# Patient Record
Sex: Female | Born: 1955 | Race: Black or African American | Hispanic: No | Marital: Single | Smoking: Former smoker
Health system: Southern US, Community
[De-identification: ages and names within clinical notes are randomized; demographics above are authoritative.]

## PROBLEM LIST (undated history)

## (undated) DIAGNOSIS — E0500 Thyrotoxicosis with diffuse goiter without thyrotoxic crisis or storm: Secondary | ICD-10-CM

## (undated) DIAGNOSIS — R7303 Prediabetes: Secondary | ICD-10-CM

## (undated) DIAGNOSIS — K648 Other hemorrhoids: Secondary | ICD-10-CM

## (undated) DIAGNOSIS — K56609 Unspecified intestinal obstruction, unspecified as to partial versus complete obstruction: Secondary | ICD-10-CM

## (undated) DIAGNOSIS — K5790 Diverticulosis of intestine, part unspecified, without perforation or abscess without bleeding: Secondary | ICD-10-CM

## (undated) DIAGNOSIS — E0590 Thyrotoxicosis, unspecified without thyrotoxic crisis or storm: Secondary | ICD-10-CM

## (undated) DIAGNOSIS — H409 Unspecified glaucoma: Secondary | ICD-10-CM

## (undated) DIAGNOSIS — K635 Polyp of colon: Secondary | ICD-10-CM

## (undated) DIAGNOSIS — I10 Essential (primary) hypertension: Secondary | ICD-10-CM

## (undated) HISTORY — PX: ABDOMINAL HYSTERECTOMY: SHX81

## (undated) HISTORY — DX: Thyrotoxicosis with diffuse goiter without thyrotoxic crisis or storm: E05.00

## (undated) HISTORY — PX: HEMORRHOID SURGERY: SHX153

## (undated) HISTORY — DX: Thyrotoxicosis, unspecified without thyrotoxic crisis or storm: E05.90

## (undated) HISTORY — DX: Other hemorrhoids: K64.8

## (undated) HISTORY — PX: TUBAL LIGATION: SHX77

## (undated) HISTORY — DX: Prediabetes: R73.03

## (undated) HISTORY — DX: Polyp of colon: K63.5

## (undated) HISTORY — PX: OTHER SURGICAL HISTORY: SHX169

## (undated) HISTORY — DX: Diverticulosis of intestine, part unspecified, without perforation or abscess without bleeding: K57.90

---

## 1998-05-11 ENCOUNTER — Other Ambulatory Visit: Admission: RE | Admit: 1998-05-11 | Discharge: 1998-05-11 | Payer: Self-pay | Admitting: Hematology and Oncology

## 1999-07-16 ENCOUNTER — Emergency Department (HOSPITAL_COMMUNITY): Admission: EM | Admit: 1999-07-16 | Discharge: 1999-07-16 | Payer: Self-pay | Admitting: Emergency Medicine

## 1999-07-16 ENCOUNTER — Encounter: Payer: Self-pay | Admitting: Emergency Medicine

## 1999-09-25 ENCOUNTER — Ambulatory Visit (HOSPITAL_COMMUNITY): Admission: RE | Admit: 1999-09-25 | Discharge: 1999-09-25 | Payer: Self-pay | Admitting: Internal Medicine

## 1999-09-25 ENCOUNTER — Encounter: Payer: Self-pay | Admitting: Internal Medicine

## 2000-07-27 ENCOUNTER — Encounter: Payer: Self-pay | Admitting: Emergency Medicine

## 2000-07-27 ENCOUNTER — Emergency Department (HOSPITAL_COMMUNITY): Admission: EM | Admit: 2000-07-27 | Discharge: 2000-07-27 | Payer: Self-pay | Admitting: Emergency Medicine

## 2000-12-03 ENCOUNTER — Ambulatory Visit (HOSPITAL_COMMUNITY): Admission: RE | Admit: 2000-12-03 | Discharge: 2000-12-03 | Payer: Self-pay | Admitting: *Deleted

## 2001-09-14 ENCOUNTER — Emergency Department (HOSPITAL_COMMUNITY): Admission: EM | Admit: 2001-09-14 | Discharge: 2001-09-14 | Payer: Self-pay | Admitting: Emergency Medicine

## 2001-09-14 ENCOUNTER — Encounter: Payer: Self-pay | Admitting: Emergency Medicine

## 2001-11-17 ENCOUNTER — Emergency Department (HOSPITAL_COMMUNITY): Admission: EM | Admit: 2001-11-17 | Discharge: 2001-11-17 | Payer: Self-pay | Admitting: Emergency Medicine

## 2002-06-13 ENCOUNTER — Emergency Department (HOSPITAL_COMMUNITY): Admission: EM | Admit: 2002-06-13 | Discharge: 2002-06-14 | Payer: Self-pay | Admitting: Emergency Medicine

## 2003-03-14 ENCOUNTER — Emergency Department (HOSPITAL_COMMUNITY): Admission: EM | Admit: 2003-03-14 | Discharge: 2003-03-14 | Payer: Self-pay | Admitting: Emergency Medicine

## 2003-09-02 ENCOUNTER — Emergency Department (HOSPITAL_COMMUNITY): Admission: AD | Admit: 2003-09-02 | Discharge: 2003-09-02 | Payer: Self-pay | Admitting: Family Medicine

## 2003-09-02 ENCOUNTER — Ambulatory Visit (HOSPITAL_COMMUNITY): Admission: RE | Admit: 2003-09-02 | Discharge: 2003-09-02 | Payer: Self-pay | Admitting: Family Medicine

## 2003-09-02 ENCOUNTER — Encounter: Payer: Self-pay | Admitting: Family Medicine

## 2003-09-18 ENCOUNTER — Emergency Department (HOSPITAL_COMMUNITY): Admission: AD | Admit: 2003-09-18 | Discharge: 2003-09-18 | Payer: Self-pay | Admitting: Family Medicine

## 2004-02-08 ENCOUNTER — Emergency Department (HOSPITAL_COMMUNITY): Admission: EM | Admit: 2004-02-08 | Discharge: 2004-02-09 | Payer: Self-pay | Admitting: Internal Medicine

## 2004-08-12 ENCOUNTER — Ambulatory Visit: Payer: Self-pay | Admitting: *Deleted

## 2004-10-07 ENCOUNTER — Ambulatory Visit: Payer: Self-pay | Admitting: Family Medicine

## 2004-10-16 ENCOUNTER — Ambulatory Visit (HOSPITAL_COMMUNITY): Admission: RE | Admit: 2004-10-16 | Discharge: 2004-10-16 | Payer: Self-pay | Admitting: Internal Medicine

## 2004-10-31 ENCOUNTER — Ambulatory Visit: Payer: Self-pay | Admitting: Family Medicine

## 2004-11-11 ENCOUNTER — Ambulatory Visit: Payer: Self-pay | Admitting: Family Medicine

## 2004-11-18 ENCOUNTER — Ambulatory Visit: Payer: Self-pay | Admitting: Family Medicine

## 2004-11-25 ENCOUNTER — Ambulatory Visit: Payer: Self-pay | Admitting: Family Medicine

## 2004-12-09 ENCOUNTER — Ambulatory Visit (HOSPITAL_COMMUNITY): Admission: RE | Admit: 2004-12-09 | Discharge: 2004-12-09 | Payer: Self-pay | Admitting: Internal Medicine

## 2004-12-26 ENCOUNTER — Ambulatory Visit: Payer: Self-pay | Admitting: Family Medicine

## 2004-12-27 ENCOUNTER — Encounter (INDEPENDENT_AMBULATORY_CARE_PROVIDER_SITE_OTHER): Payer: Self-pay | Admitting: Specialist

## 2004-12-27 ENCOUNTER — Ambulatory Visit (HOSPITAL_COMMUNITY): Admission: RE | Admit: 2004-12-27 | Discharge: 2004-12-27 | Payer: Self-pay | Admitting: General Surgery

## 2005-01-10 ENCOUNTER — Emergency Department (HOSPITAL_COMMUNITY): Admission: EM | Admit: 2005-01-10 | Discharge: 2005-01-10 | Payer: Self-pay | Admitting: Emergency Medicine

## 2005-01-28 ENCOUNTER — Ambulatory Visit: Payer: Self-pay | Admitting: Obstetrics and Gynecology

## 2005-03-21 ENCOUNTER — Ambulatory Visit: Payer: Self-pay | Admitting: Family Medicine

## 2005-03-27 ENCOUNTER — Ambulatory Visit: Payer: Self-pay | Admitting: Family Medicine

## 2005-04-29 ENCOUNTER — Ambulatory Visit: Payer: Self-pay | Admitting: Family Medicine

## 2005-05-28 ENCOUNTER — Ambulatory Visit: Payer: Self-pay | Admitting: Family Medicine

## 2005-07-24 ENCOUNTER — Ambulatory Visit: Payer: Self-pay | Admitting: Family Medicine

## 2005-07-24 LAB — CONVERTED CEMR LAB

## 2005-12-08 ENCOUNTER — Ambulatory Visit: Payer: Self-pay | Admitting: Family Medicine

## 2006-01-15 ENCOUNTER — Ambulatory Visit: Payer: Self-pay | Admitting: Family Medicine

## 2006-01-16 ENCOUNTER — Ambulatory Visit (HOSPITAL_COMMUNITY): Admission: RE | Admit: 2006-01-16 | Discharge: 2006-01-16 | Payer: Self-pay | Admitting: Internal Medicine

## 2006-02-19 ENCOUNTER — Ambulatory Visit (HOSPITAL_COMMUNITY): Admission: RE | Admit: 2006-02-19 | Discharge: 2006-02-19 | Payer: Self-pay | Admitting: General Surgery

## 2006-02-23 ENCOUNTER — Ambulatory Visit: Payer: Self-pay | Admitting: Family Medicine

## 2006-04-27 ENCOUNTER — Ambulatory Visit: Payer: Self-pay | Admitting: Family Medicine

## 2006-05-20 ENCOUNTER — Ambulatory Visit: Payer: Self-pay | Admitting: Family Medicine

## 2006-05-26 ENCOUNTER — Ambulatory Visit: Payer: Self-pay | Admitting: Family Medicine

## 2006-06-04 ENCOUNTER — Ambulatory Visit: Payer: Self-pay | Admitting: Obstetrics & Gynecology

## 2006-06-09 ENCOUNTER — Ambulatory Visit (HOSPITAL_COMMUNITY): Admission: RE | Admit: 2006-06-09 | Discharge: 2006-06-09 | Payer: Self-pay | Admitting: Obstetrics and Gynecology

## 2006-06-18 ENCOUNTER — Ambulatory Visit: Payer: Self-pay | Admitting: Obstetrics and Gynecology

## 2006-06-23 ENCOUNTER — Ambulatory Visit: Payer: Self-pay | Admitting: Family Medicine

## 2006-07-25 ENCOUNTER — Emergency Department (HOSPITAL_COMMUNITY): Admission: EM | Admit: 2006-07-25 | Discharge: 2006-07-25 | Payer: Self-pay | Admitting: Emergency Medicine

## 2006-08-04 ENCOUNTER — Ambulatory Visit: Payer: Self-pay | Admitting: Family Medicine

## 2006-09-02 ENCOUNTER — Ambulatory Visit: Payer: Self-pay | Admitting: Family Medicine

## 2006-09-10 DIAGNOSIS — Z9079 Acquired absence of other genital organ(s): Secondary | ICD-10-CM | POA: Insufficient documentation

## 2006-09-14 ENCOUNTER — Inpatient Hospital Stay (HOSPITAL_COMMUNITY): Admission: RE | Admit: 2006-09-14 | Discharge: 2006-09-17 | Payer: Self-pay | Admitting: Obstetrics and Gynecology

## 2006-09-14 ENCOUNTER — Ambulatory Visit: Payer: Self-pay | Admitting: Obstetrics and Gynecology

## 2006-09-14 ENCOUNTER — Encounter (INDEPENDENT_AMBULATORY_CARE_PROVIDER_SITE_OTHER): Payer: Self-pay | Admitting: *Deleted

## 2006-09-30 ENCOUNTER — Ambulatory Visit: Payer: Self-pay | Admitting: Obstetrics & Gynecology

## 2006-10-28 ENCOUNTER — Ambulatory Visit: Payer: Self-pay | Admitting: Obstetrics & Gynecology

## 2007-02-24 ENCOUNTER — Ambulatory Visit: Payer: Self-pay | Admitting: Family Medicine

## 2007-04-19 ENCOUNTER — Encounter (INDEPENDENT_AMBULATORY_CARE_PROVIDER_SITE_OTHER): Payer: Self-pay | Admitting: Family Medicine

## 2007-04-19 ENCOUNTER — Ambulatory Visit: Payer: Self-pay | Admitting: Internal Medicine

## 2007-04-19 DIAGNOSIS — D259 Leiomyoma of uterus, unspecified: Secondary | ICD-10-CM | POA: Insufficient documentation

## 2007-04-19 DIAGNOSIS — D509 Iron deficiency anemia, unspecified: Secondary | ICD-10-CM

## 2007-04-19 DIAGNOSIS — F1010 Alcohol abuse, uncomplicated: Secondary | ICD-10-CM | POA: Insufficient documentation

## 2007-04-19 DIAGNOSIS — K649 Unspecified hemorrhoids: Secondary | ICD-10-CM

## 2007-04-19 DIAGNOSIS — I10 Essential (primary) hypertension: Secondary | ICD-10-CM | POA: Insufficient documentation

## 2007-07-14 ENCOUNTER — Ambulatory Visit: Payer: Self-pay | Admitting: Internal Medicine

## 2007-07-15 ENCOUNTER — Ambulatory Visit (HOSPITAL_COMMUNITY): Admission: RE | Admit: 2007-07-15 | Discharge: 2007-07-15 | Payer: Self-pay | Admitting: Nurse Practitioner

## 2007-07-19 ENCOUNTER — Ambulatory Visit (HOSPITAL_COMMUNITY): Admission: RE | Admit: 2007-07-19 | Discharge: 2007-07-19 | Payer: Self-pay | Admitting: Family Medicine

## 2007-07-26 ENCOUNTER — Ambulatory Visit: Payer: Self-pay | Admitting: Nurse Practitioner

## 2007-07-28 ENCOUNTER — Encounter (INDEPENDENT_AMBULATORY_CARE_PROVIDER_SITE_OTHER): Payer: Self-pay | Admitting: *Deleted

## 2007-08-09 ENCOUNTER — Ambulatory Visit: Payer: Self-pay | Admitting: Nurse Practitioner

## 2007-09-09 ENCOUNTER — Ambulatory Visit: Payer: Self-pay | Admitting: Internal Medicine

## 2007-10-11 ENCOUNTER — Inpatient Hospital Stay (HOSPITAL_COMMUNITY): Admission: AD | Admit: 2007-10-11 | Discharge: 2007-10-11 | Payer: Self-pay | Admitting: Obstetrics and Gynecology

## 2007-10-14 ENCOUNTER — Ambulatory Visit: Payer: Self-pay | Admitting: Family Medicine

## 2007-10-19 ENCOUNTER — Encounter (INDEPENDENT_AMBULATORY_CARE_PROVIDER_SITE_OTHER): Payer: Self-pay | Admitting: Nurse Practitioner

## 2007-10-19 ENCOUNTER — Ambulatory Visit: Payer: Self-pay | Admitting: Family Medicine

## 2007-10-19 LAB — CONVERTED CEMR LAB
ALT: 20 units/L (ref 0–35)
BUN: 14 mg/dL (ref 6–23)
CO2: 29 meq/L (ref 19–32)
Calcium: 9.6 mg/dL (ref 8.4–10.5)
Chloride: 102 meq/L (ref 96–112)
Creatinine, Ser: 0.76 mg/dL (ref 0.40–1.20)
Glucose, Bld: 87 mg/dL (ref 70–99)
HCT: 41.6 % (ref 36.0–46.0)
Hemoglobin: 13.5 g/dL (ref 12.0–15.0)
Lymphocytes Relative: 31 % (ref 12–46)
Lymphs Abs: 1.2 10*3/uL (ref 0.7–4.0)
Monocytes Absolute: 0.5 10*3/uL (ref 0.1–1.0)
Monocytes Relative: 13 % — ABNORMAL HIGH (ref 3–12)
Neutro Abs: 1.9 10*3/uL (ref 1.7–7.7)
RBC: 4.42 M/uL (ref 3.87–5.11)

## 2007-10-28 ENCOUNTER — Ambulatory Visit: Payer: Self-pay | Admitting: Obstetrics and Gynecology

## 2007-11-22 ENCOUNTER — Ambulatory Visit (HOSPITAL_COMMUNITY): Admission: RE | Admit: 2007-11-22 | Discharge: 2007-11-22 | Payer: Self-pay | Admitting: Obstetrics and Gynecology

## 2007-11-23 ENCOUNTER — Ambulatory Visit: Payer: Self-pay | Admitting: Internal Medicine

## 2007-12-07 ENCOUNTER — Ambulatory Visit: Payer: Self-pay | Admitting: Internal Medicine

## 2008-01-07 ENCOUNTER — Ambulatory Visit: Payer: Self-pay | Admitting: Internal Medicine

## 2008-01-07 ENCOUNTER — Encounter (INDEPENDENT_AMBULATORY_CARE_PROVIDER_SITE_OTHER): Payer: Self-pay | Admitting: Nurse Practitioner

## 2008-01-07 LAB — CONVERTED CEMR LAB
Amphetamine Screen, Ur: NEGATIVE
Benzodiazepines.: NEGATIVE
Cocaine Metabolites: NEGATIVE
Methadone: NEGATIVE
Phencyclidine (PCP): NEGATIVE

## 2008-01-18 ENCOUNTER — Encounter (INDEPENDENT_AMBULATORY_CARE_PROVIDER_SITE_OTHER): Payer: Self-pay | Admitting: Nurse Practitioner

## 2008-01-18 ENCOUNTER — Ambulatory Visit: Payer: Self-pay | Admitting: Family Medicine

## 2008-01-18 LAB — CONVERTED CEMR LAB
Benzodiazepines.: NEGATIVE
Creatinine,U: 57.8 mg/dL
Marijuana Metabolite: NEGATIVE
Phencyclidine (PCP): NEGATIVE
Propoxyphene: NEGATIVE

## 2008-02-01 ENCOUNTER — Ambulatory Visit: Payer: Self-pay | Admitting: Internal Medicine

## 2008-02-17 ENCOUNTER — Ambulatory Visit: Payer: Self-pay | Admitting: *Deleted

## 2008-02-24 ENCOUNTER — Emergency Department (HOSPITAL_COMMUNITY): Admission: EM | Admit: 2008-02-24 | Discharge: 2008-02-25 | Payer: Self-pay | Admitting: Emergency Medicine

## 2008-03-07 ENCOUNTER — Ambulatory Visit: Payer: Self-pay | Admitting: Internal Medicine

## 2008-03-07 ENCOUNTER — Encounter (INDEPENDENT_AMBULATORY_CARE_PROVIDER_SITE_OTHER): Payer: Self-pay | Admitting: Nurse Practitioner

## 2008-03-07 LAB — CONVERTED CEMR LAB
Cocaine Metabolites: NEGATIVE
Creatinine,U: 30.5 mg/dL
Opiate Screen, Urine: NEGATIVE
Phencyclidine (PCP): NEGATIVE

## 2008-06-02 ENCOUNTER — Ambulatory Visit: Payer: Self-pay | Admitting: Internal Medicine

## 2008-07-20 ENCOUNTER — Ambulatory Visit: Payer: Self-pay | Admitting: Family Medicine

## 2008-08-02 ENCOUNTER — Emergency Department (HOSPITAL_COMMUNITY): Admission: EM | Admit: 2008-08-02 | Discharge: 2008-08-02 | Payer: Self-pay | Admitting: Emergency Medicine

## 2008-08-07 ENCOUNTER — Ambulatory Visit: Payer: Self-pay | Admitting: Internal Medicine

## 2008-08-08 ENCOUNTER — Ambulatory Visit (HOSPITAL_COMMUNITY): Admission: RE | Admit: 2008-08-08 | Discharge: 2008-08-08 | Payer: Self-pay | Admitting: Family Medicine

## 2008-09-26 ENCOUNTER — Encounter: Payer: Self-pay | Admitting: Internal Medicine

## 2008-09-26 ENCOUNTER — Ambulatory Visit: Payer: Self-pay | Admitting: Internal Medicine

## 2008-09-26 LAB — CONVERTED CEMR LAB
Cholesterol: 179 mg/dL (ref 0–200)
HDL: 62 mg/dL (ref >39)
Total CHOL/HDL Ratio: 2.9

## 2008-10-02 ENCOUNTER — Ambulatory Visit: Payer: Self-pay | Admitting: Internal Medicine

## 2008-10-22 ENCOUNTER — Emergency Department (HOSPITAL_COMMUNITY): Admission: EM | Admit: 2008-10-22 | Discharge: 2008-10-22 | Payer: Self-pay | Admitting: Emergency Medicine

## 2008-10-23 ENCOUNTER — Inpatient Hospital Stay (HOSPITAL_COMMUNITY): Admission: AD | Admit: 2008-10-23 | Discharge: 2008-10-23 | Payer: Self-pay | Admitting: Obstetrics & Gynecology

## 2008-10-30 ENCOUNTER — Ambulatory Visit: Payer: Self-pay | Admitting: Internal Medicine

## 2008-10-30 ENCOUNTER — Encounter (INDEPENDENT_AMBULATORY_CARE_PROVIDER_SITE_OTHER): Payer: Self-pay | Admitting: Adult Health

## 2008-10-30 LAB — CONVERTED CEMR LAB
Alkaline Phosphatase: 58 units/L (ref 39–117)
BUN: 17 mg/dL (ref 6–23)
Glucose, Bld: 92 mg/dL (ref 70–99)
Total Bilirubin: 0.5 mg/dL (ref 0.3–1.2)

## 2008-11-06 ENCOUNTER — Ambulatory Visit: Payer: Self-pay | Admitting: Internal Medicine

## 2008-11-15 ENCOUNTER — Ambulatory Visit: Payer: Self-pay | Admitting: Internal Medicine

## 2008-12-27 ENCOUNTER — Ambulatory Visit: Payer: Self-pay | Admitting: Family Medicine

## 2009-04-12 ENCOUNTER — Ambulatory Visit: Payer: Self-pay | Admitting: Internal Medicine

## 2009-05-16 ENCOUNTER — Ambulatory Visit: Payer: Self-pay | Admitting: Family Medicine

## 2009-07-04 ENCOUNTER — Emergency Department (HOSPITAL_COMMUNITY): Admission: EM | Admit: 2009-07-04 | Discharge: 2009-07-04 | Payer: Self-pay | Admitting: Family Medicine

## 2009-09-19 ENCOUNTER — Encounter (INDEPENDENT_AMBULATORY_CARE_PROVIDER_SITE_OTHER): Payer: Self-pay | Admitting: Adult Health

## 2009-09-19 ENCOUNTER — Encounter: Payer: Self-pay | Admitting: Family Medicine

## 2009-09-19 ENCOUNTER — Ambulatory Visit: Payer: Self-pay | Admitting: Internal Medicine

## 2009-09-19 LAB — CONVERTED CEMR LAB
ALT: 20 units/L (ref 0–35)
Albumin: 5.2 g/dL (ref 3.5–5.2)
Alkaline Phosphatase: 75 units/L (ref 39–117)
Basophils Absolute: 0 10*3/uL (ref 0.0–0.1)
CO2: 25 meq/L (ref 19–32)
Glucose, Bld: 99 mg/dL (ref 70–99)
Hemoglobin: 14.1 g/dL (ref 12.0–15.0)
Lymphocytes Relative: 38 % (ref 12–46)
Microalb, Ur: 1.94 mg/dL — ABNORMAL HIGH (ref 0.00–1.89)
Monocytes Absolute: 0.7 10*3/uL (ref 0.1–1.0)
Monocytes Relative: 13 % — ABNORMAL HIGH (ref 3–12)
Neutro Abs: 2.5 10*3/uL (ref 1.7–7.7)
Potassium: 3.6 meq/L (ref 3.5–5.3)
RBC: 4.72 M/uL (ref 3.87–5.11)
Sodium: 137 meq/L (ref 135–145)
Total Protein: 7.9 g/dL (ref 6.0–8.3)

## 2009-10-31 IMAGING — US US TRANSVAGINAL NON-OB
1 series · 13 of 25 positions shown · non-contrast
Comparison: none

CLINICAL DATA: Status post hysterectomy with abdominal pain.  Both ovaries remain.  
TRANSABDOMINAL AND TRANSVAGINAL PELVIC ULTRASOUND:
TECHNIQUE: Both transabdominal and transvaginal ultrasound examinations of the pelvis were performed.

[Series 1: us transvaginal non-ob · 0.30mm/px · 13 of 45 slices shown]
[im 1/45]
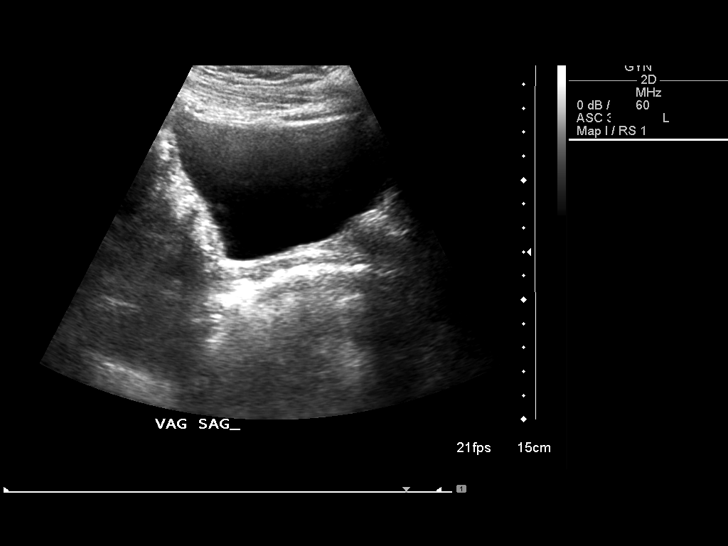
[im 4/45]
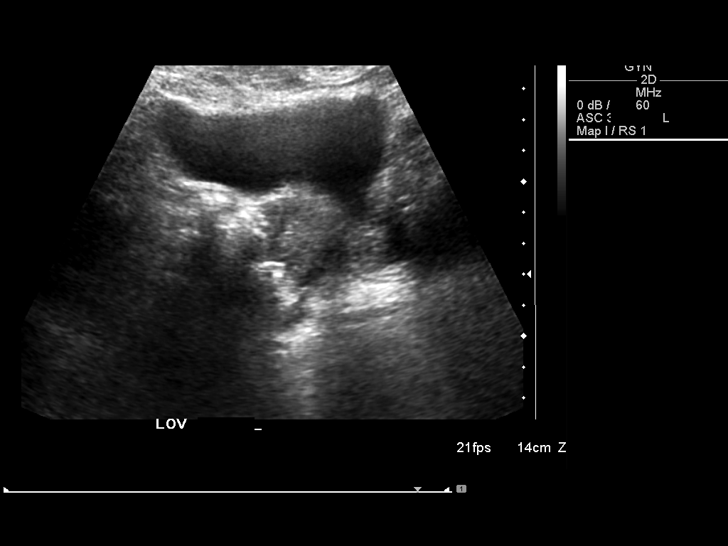
[im 8/45]
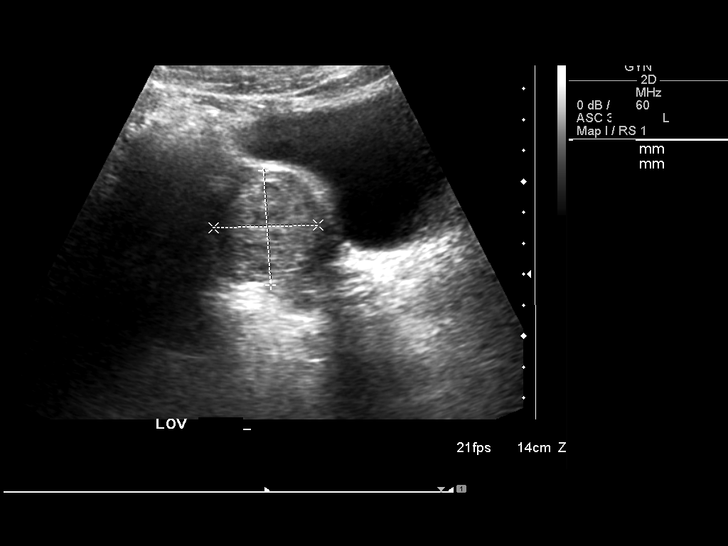
[im 12/45]
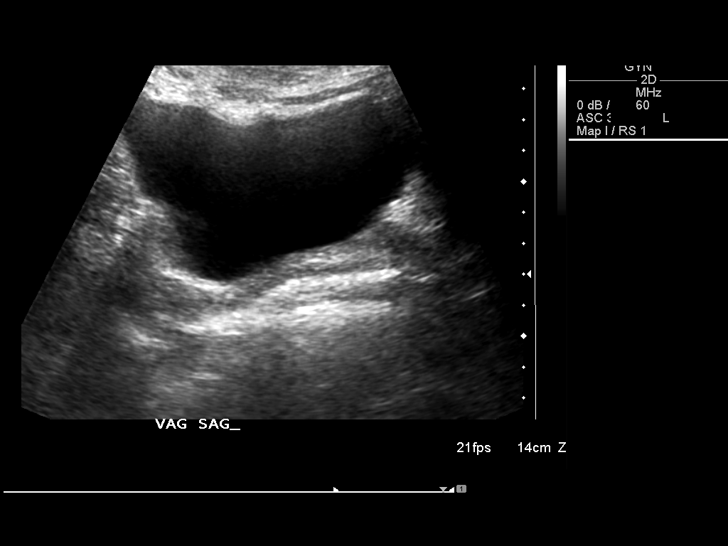
[im 15/45]
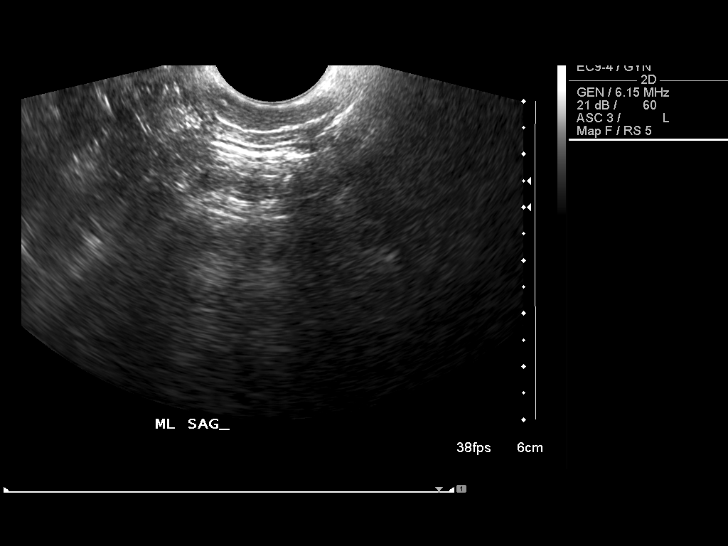
[im 19/45]
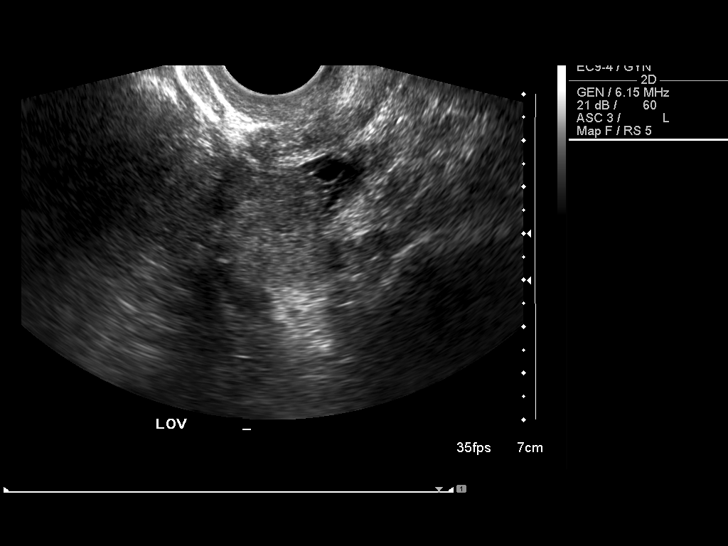
[im 23/45]
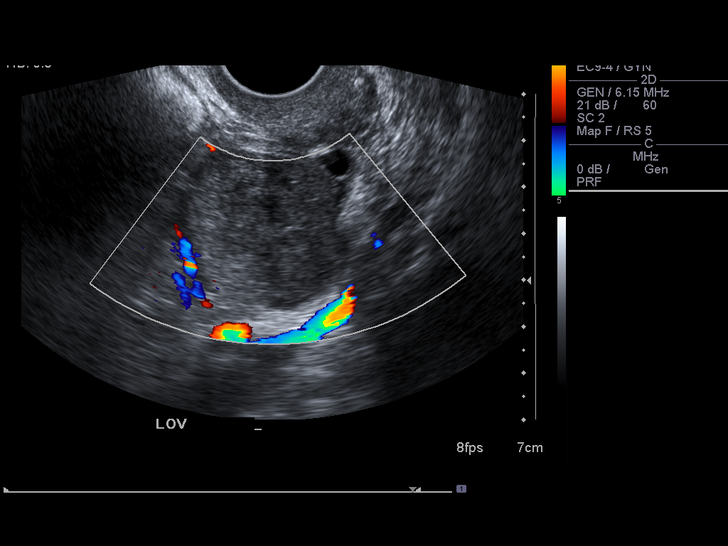
[im 26/45]
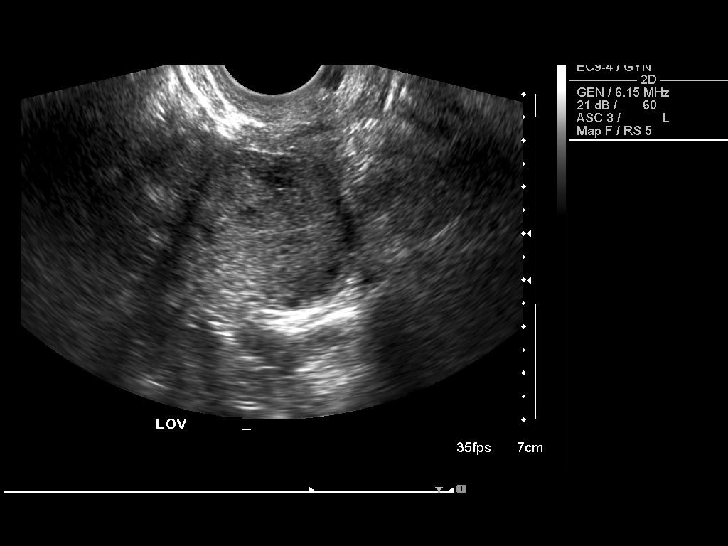
[im 30/45]
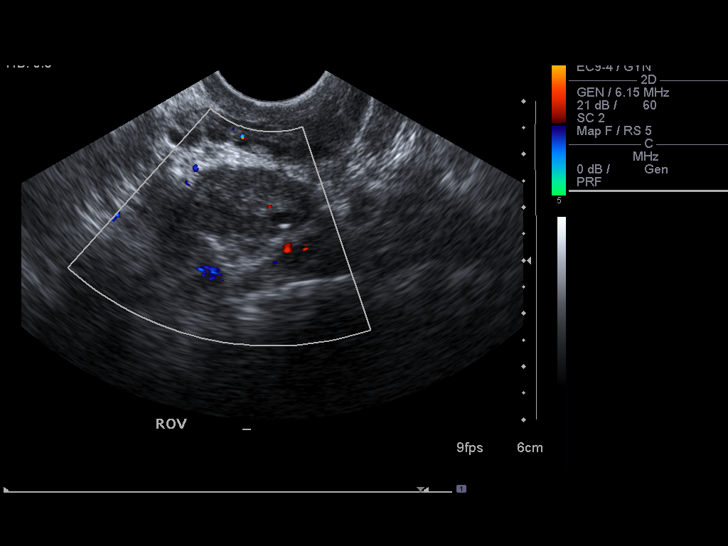
[im 34/45]
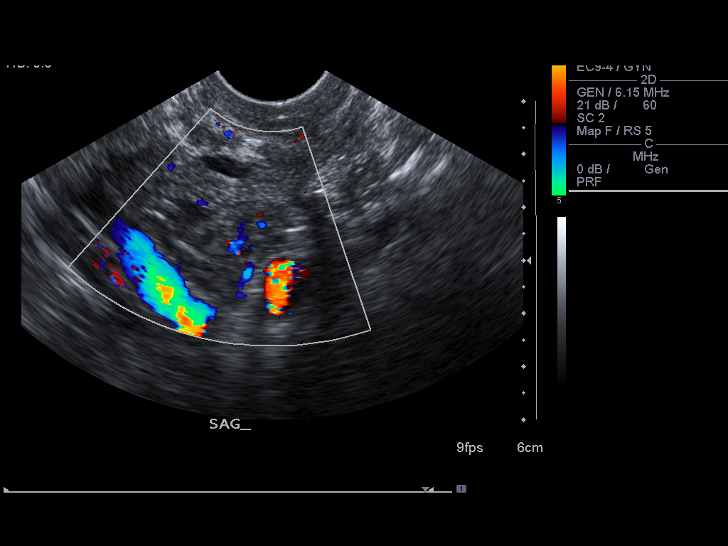
[im 37/45]
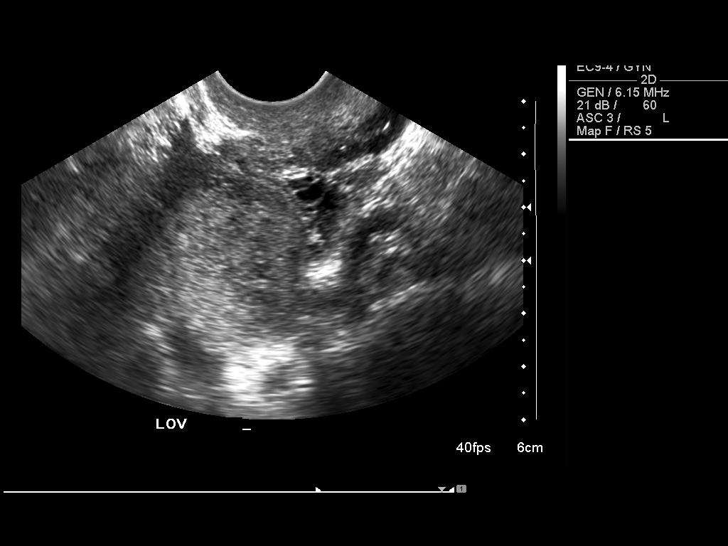
[im 41/45]
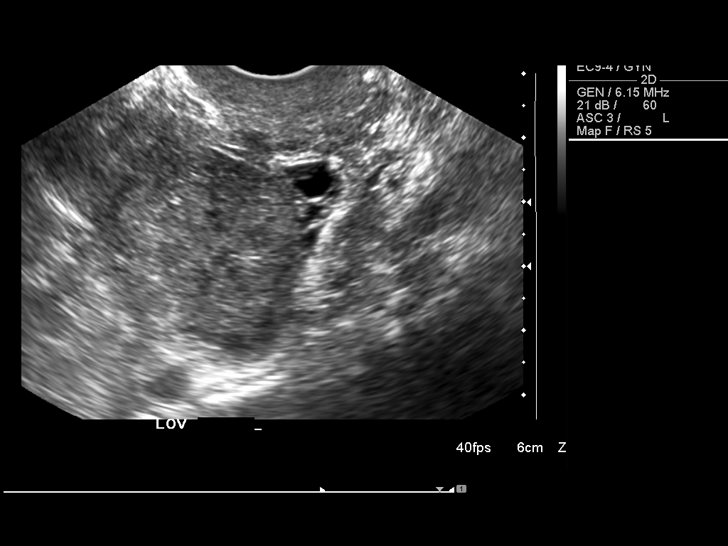
[im 45/45]
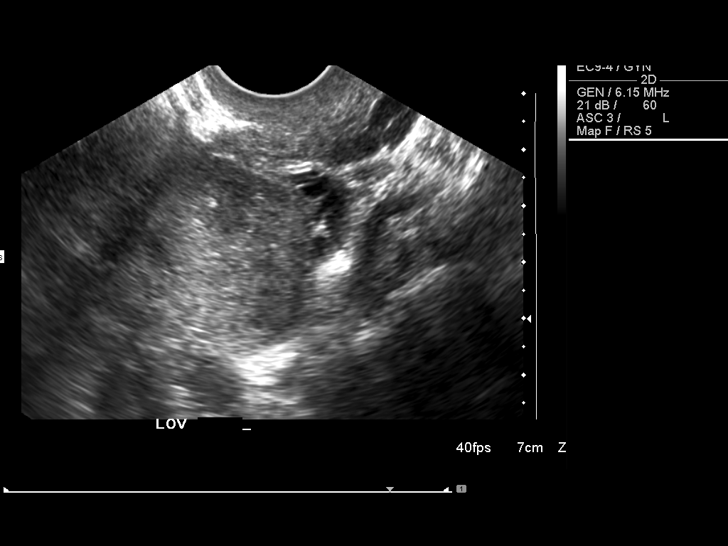

[13 of 25 positions shown; findings below may reference images not displayed]

FINDINGS: A normal vaginal cuff is seen.  The right ovary has a normal appearance measuring 1.4 x 2.2 x 1.2 cm.  
There is a left adnexal mass identified measuring 3.7 x 3.4 x 3.7 cm.  The echotexture is most suggestive of a solid lesion; however, some through transmission is seen suggesting that this may have a semisolid or cystic component.  No normal ovarian tissue or separate normal left ovary is seen making this suspicious for a left ovarian abnormality.  Given the patient?s history of pain, in the appropriate clinical setting, the possibility of a torsion on this side would need to be considered.  Color Doppler demonstrates only minimal flow within this area.  No significant peripheral flow or vascular twisting is seen to suggest torsion by color Doppler criteria.  The possibility of an ovarian neoplastic process would need to be considered.  It is also possible that this is not ovarian in etiology and the left ovary is simply not visualized.  Given the increased through transmission, an atypical hemorrhagic cyst could be considered although the appearance is not suggestive.  No evidence for free fluid is seen.
IMPRESSION: Normal vaginal cuff and right ovary.  Left adnexal mass with no normal surrounding ovarian tissue or separate left ovary.  Considerations would include an ovarian torsion in the correct clinical setting or an ovarian neoplastic process.  Short-term followup in 4-6 weeks would be recommended if clinical concern does not warrant surgical evaluation.

## 2009-11-15 ENCOUNTER — Encounter (INDEPENDENT_AMBULATORY_CARE_PROVIDER_SITE_OTHER): Payer: Self-pay | Admitting: Family Medicine

## 2009-11-15 ENCOUNTER — Ambulatory Visit: Payer: Self-pay | Admitting: Internal Medicine

## 2009-11-15 DIAGNOSIS — F17200 Nicotine dependence, unspecified, uncomplicated: Secondary | ICD-10-CM

## 2009-11-15 DIAGNOSIS — J069 Acute upper respiratory infection, unspecified: Secondary | ICD-10-CM | POA: Insufficient documentation

## 2009-11-15 LAB — CONVERTED CEMR LAB
Albumin: 4.7 g/dL (ref 3.5–5.2)
BUN: 23 mg/dL (ref 6–23)
Basophils Relative: 0 % (ref 0–1)
CO2: 26 meq/L (ref 19–32)
Calcium: 9.1 mg/dL (ref 8.4–10.5)
Chloride: 102 meq/L (ref 96–112)
Creatinine, Ser: 0.88 mg/dL (ref 0.40–1.20)
Eosinophils Absolute: 0.2 10*3/uL (ref 0.0–0.7)
Eosinophils Relative: 3 % (ref 0–5)
Glucose, Bld: 98 mg/dL (ref 70–99)
HCT: 38.5 % (ref 36.0–46.0)
MCHC: 33.5 g/dL (ref 30.0–36.0)
MCV: 90.4 fL (ref 78.0–100.0)
Monocytes Absolute: 0.7 10*3/uL (ref 0.1–1.0)
Monocytes Relative: 13 % — ABNORMAL HIGH (ref 3–12)
Neutrophils Relative %: 41 % — ABNORMAL LOW (ref 43–77)
Potassium: 3.5 meq/L (ref 3.5–5.3)
RBC: 4.26 M/uL (ref 3.87–5.11)

## 2009-11-28 ENCOUNTER — Emergency Department (HOSPITAL_COMMUNITY): Admission: EM | Admit: 2009-11-28 | Discharge: 2009-11-28 | Payer: Self-pay | Admitting: Family Medicine

## 2009-11-28 ENCOUNTER — Ambulatory Visit (HOSPITAL_COMMUNITY): Admission: RE | Admit: 2009-11-28 | Discharge: 2009-11-28 | Payer: Self-pay | Admitting: Family Medicine

## 2009-12-10 ENCOUNTER — Encounter (INDEPENDENT_AMBULATORY_CARE_PROVIDER_SITE_OTHER): Payer: Self-pay

## 2009-12-11 ENCOUNTER — Ambulatory Visit: Payer: Self-pay | Admitting: Gastroenterology

## 2009-12-12 ENCOUNTER — Encounter: Payer: Self-pay | Admitting: Gastroenterology

## 2009-12-12 ENCOUNTER — Telehealth: Payer: Self-pay | Admitting: Gastroenterology

## 2009-12-12 IMAGING — US US TRANSVAGINAL NON-OB
1 series · 18 of 23 positions shown · non-contrast
Comparison: Previous exam on 10/11/07.

CLINICAL DATA: Follow-up left adnexal mass.  Patient is status post hysterectomy.  
TRANSVAGINAL PELVIC ULTRASOUND:
TECHNIQUE:

[Series 1: us transvaginal non-ob · 18 of 23 slices shown]
[im 1/23]
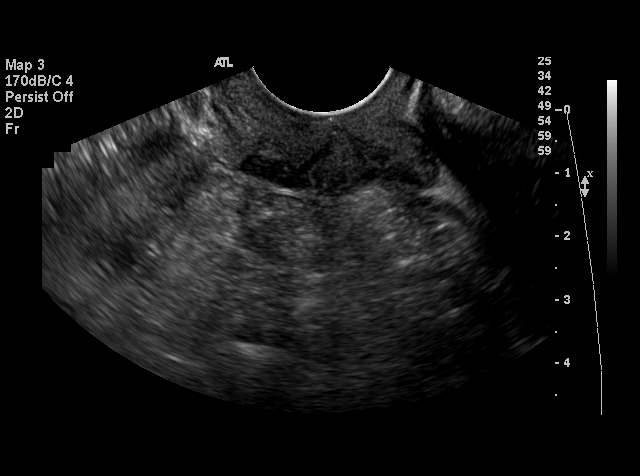
[im 2/23]
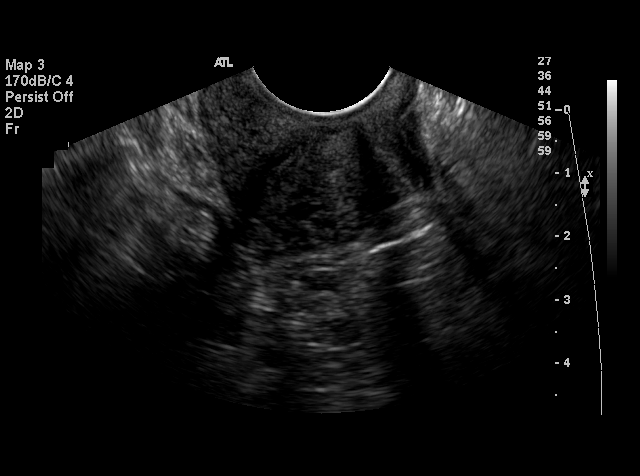
[im 4/23]
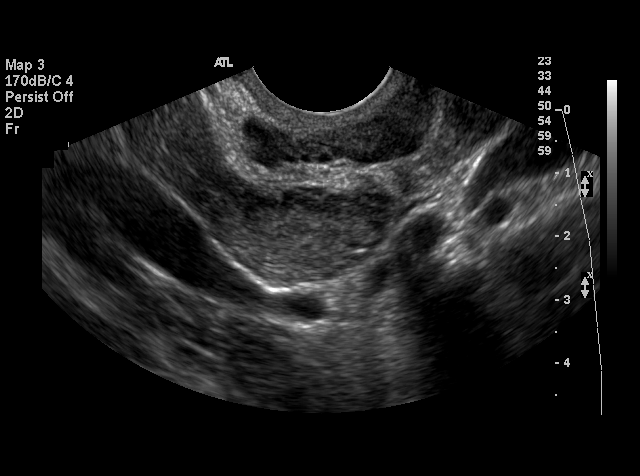
[im 5/23]
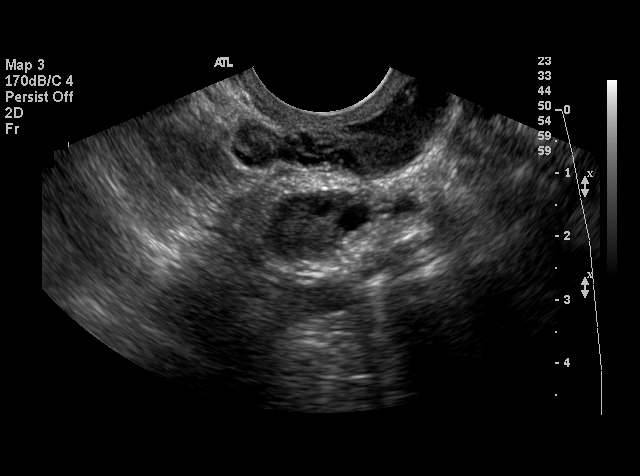
[im 6/23]
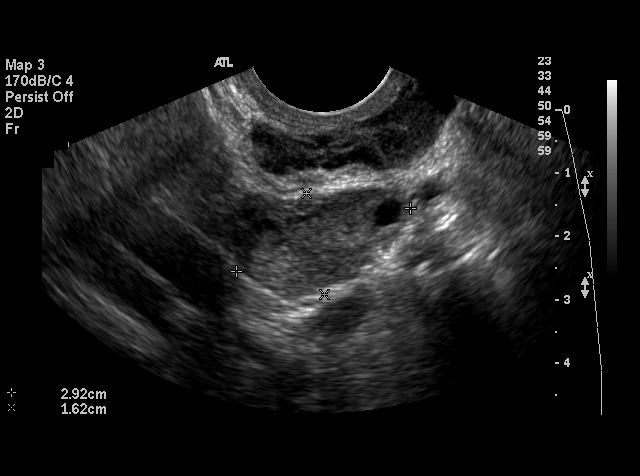
[im 8/23]
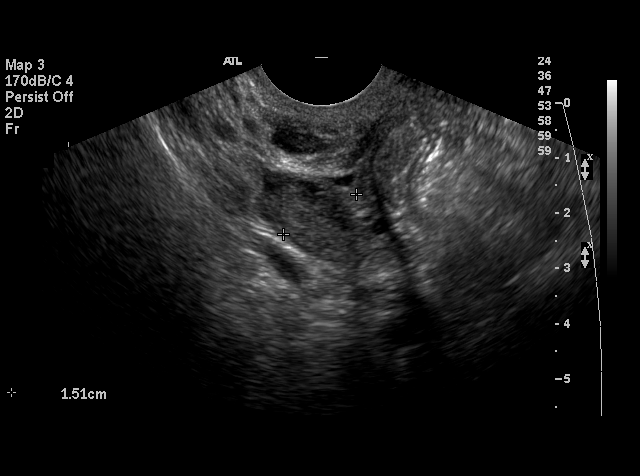
[im 9/23]
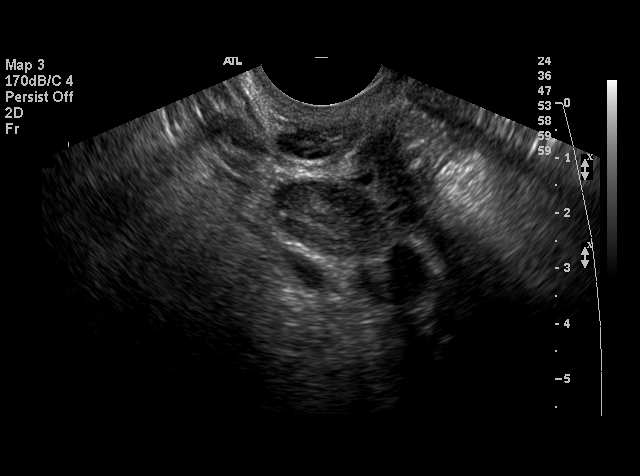
[im 10/23]
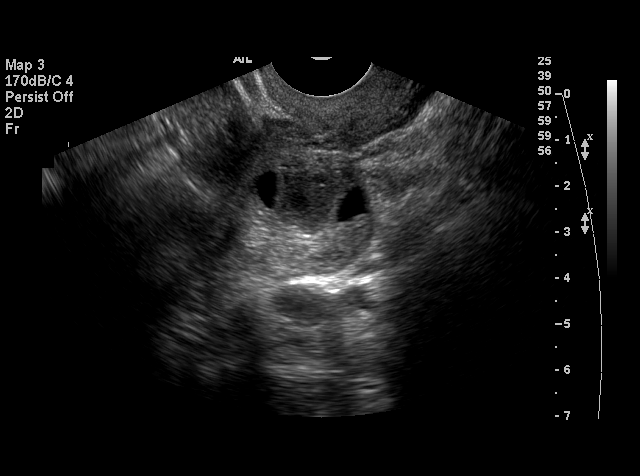
[im 11/23]
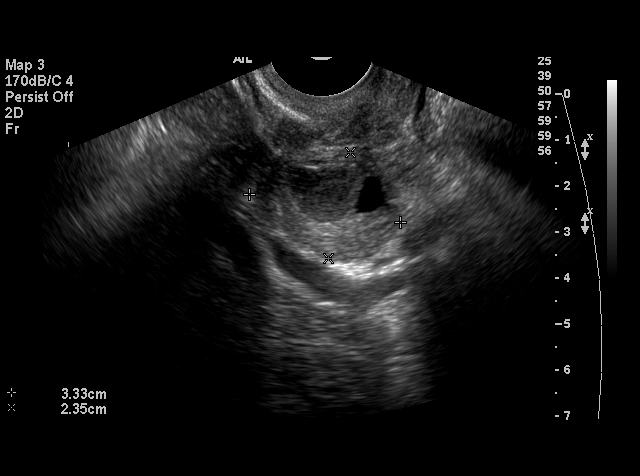
[im 13/23]
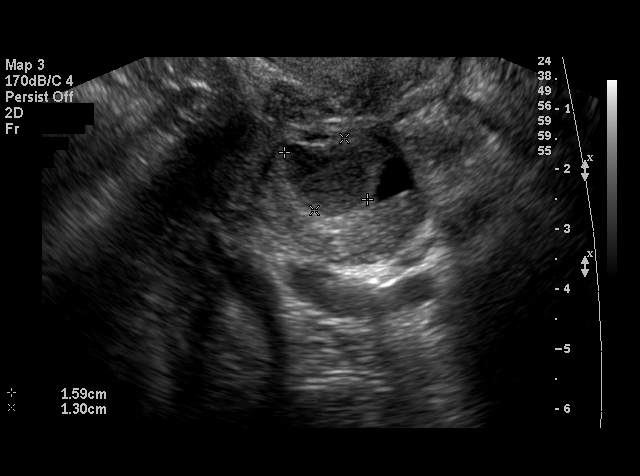
[im 14/23]
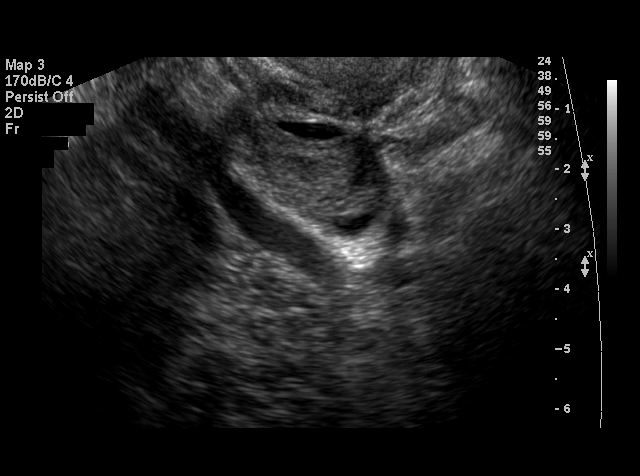
[im 15/23]
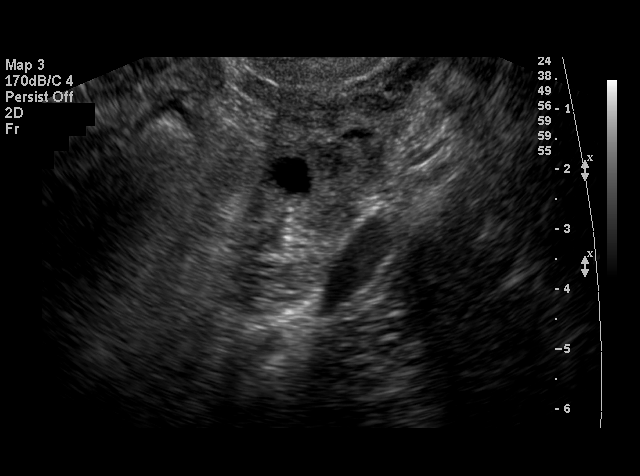
[im 16/23]
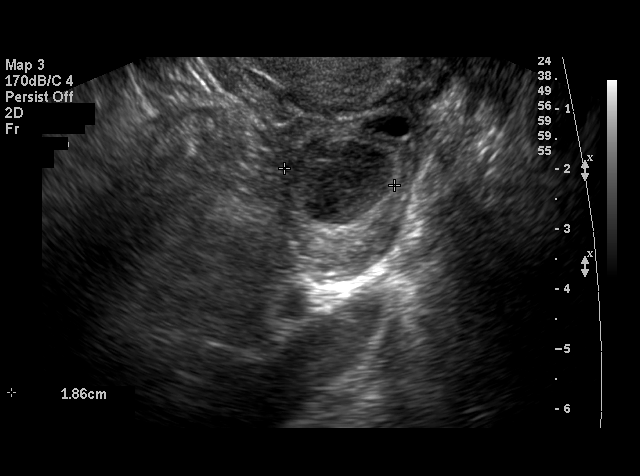
[im 18/23]
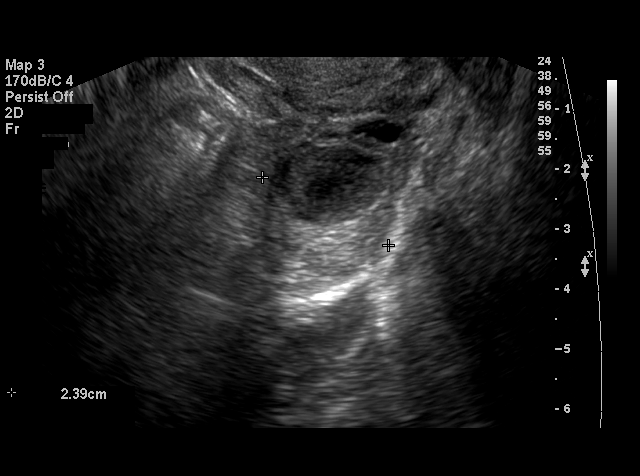
[im 19/23]
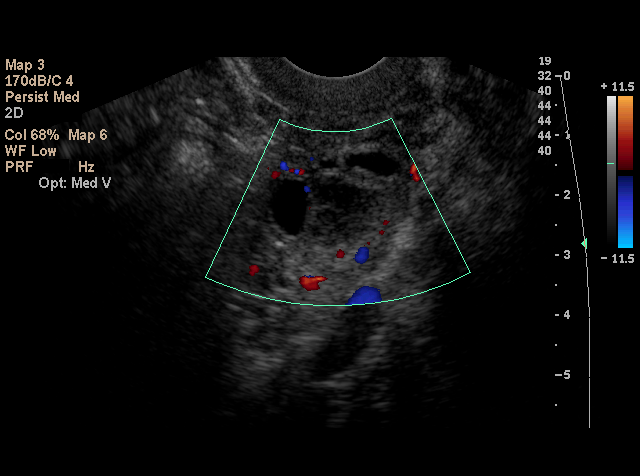
[im 20/23]
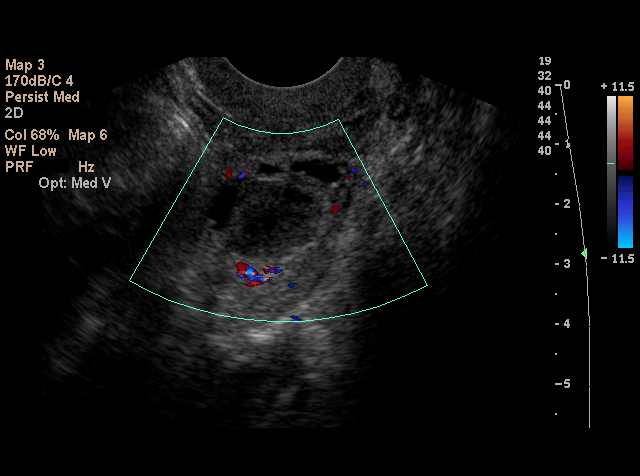
[im 22/23]
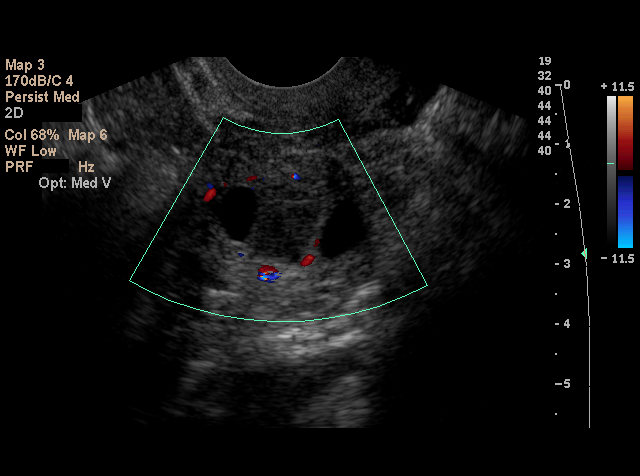
[im 23/23]
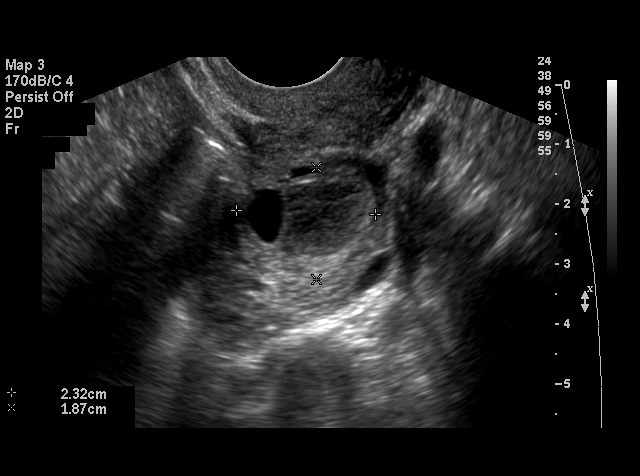

[18 of 23 positions shown; findings below may reference images not displayed]

FINDINGS: A normal vaginal cuff is seen.  The right ovary again has a normal appearance measuring 2.9 x 1.6 x 1.5 cm.  The left ovary is smaller today measuring 3.3 x 2.4 x 2.4 cm.  This contains a small complex cystic area measuring 1.6 x 1.3 x 1.9 cm, which has diffuse low level echoes suggestive of a small hemorrhagic cyst.  In correlation with the prior exam of 10/11/07, there has been interval resolution of the complex mass seen on this side and I suspect that the small hemorrhagic appearing cyst represents the residua of an acute hemorrhagic cyst on that prior exam.   
No cul-de-sac or periovarian fluid is seen and no separate adnexal masses are noted.
IMPRESSION: Interval resolution of a complex left adnexal mass seen on 10/11/07 with a small stromal complex cyst today suggestive of a hemorrhagic cyst.  The appearance raises the possibility that the previous appearance was the result of an atypical hemorrhagic cyst, which has subsequently nearly completely resolved since that exam.  Normal right ovary.

## 2009-12-17 ENCOUNTER — Telehealth: Payer: Self-pay | Admitting: Gastroenterology

## 2009-12-17 ENCOUNTER — Ambulatory Visit: Payer: Self-pay | Admitting: Family Medicine

## 2009-12-24 ENCOUNTER — Telehealth: Payer: Self-pay | Admitting: Gastroenterology

## 2009-12-25 ENCOUNTER — Ambulatory Visit: Payer: Self-pay | Admitting: Gastroenterology

## 2009-12-27 ENCOUNTER — Encounter: Payer: Self-pay | Admitting: Gastroenterology

## 2009-12-27 DIAGNOSIS — K635 Polyp of colon: Secondary | ICD-10-CM

## 2009-12-27 HISTORY — DX: Polyp of colon: K63.5

## 2010-02-08 ENCOUNTER — Ambulatory Visit: Payer: Self-pay | Admitting: Internal Medicine

## 2010-02-08 ENCOUNTER — Telehealth: Payer: Self-pay | Admitting: Gastroenterology

## 2010-02-20 ENCOUNTER — Telehealth: Payer: Self-pay | Admitting: Nurse Practitioner

## 2010-02-25 ENCOUNTER — Ambulatory Visit (HOSPITAL_COMMUNITY): Admission: RE | Admit: 2010-02-25 | Discharge: 2010-02-25 | Payer: Self-pay | Admitting: Internal Medicine

## 2010-02-25 ENCOUNTER — Ambulatory Visit: Payer: Self-pay | Admitting: Internal Medicine

## 2010-03-04 ENCOUNTER — Ambulatory Visit: Payer: Self-pay | Admitting: Internal Medicine

## 2010-04-29 ENCOUNTER — Ambulatory Visit: Payer: Self-pay | Admitting: Internal Medicine

## 2010-05-28 ENCOUNTER — Emergency Department (HOSPITAL_COMMUNITY): Admission: EM | Admit: 2010-05-28 | Discharge: 2010-05-28 | Payer: Self-pay | Admitting: Family Medicine

## 2010-06-20 ENCOUNTER — Telehealth: Payer: Self-pay | Admitting: Gastroenterology

## 2010-06-26 ENCOUNTER — Telehealth: Payer: Self-pay | Admitting: Gastroenterology

## 2010-07-06 ENCOUNTER — Emergency Department (HOSPITAL_COMMUNITY): Admission: EM | Admit: 2010-07-06 | Discharge: 2010-07-07 | Payer: Self-pay | Admitting: Emergency Medicine

## 2010-07-14 ENCOUNTER — Emergency Department (HOSPITAL_COMMUNITY): Admission: EM | Admit: 2010-07-14 | Discharge: 2010-07-14 | Payer: Self-pay | Admitting: Family Medicine

## 2010-08-07 ENCOUNTER — Encounter (INDEPENDENT_AMBULATORY_CARE_PROVIDER_SITE_OTHER): Payer: Self-pay | Admitting: Family Medicine

## 2010-08-07 ENCOUNTER — Ambulatory Visit: Payer: Self-pay | Admitting: Internal Medicine

## 2010-08-07 LAB — CONVERTED CEMR LAB
CO2: 30 meq/L (ref 19–32)
Chloride: 100 meq/L (ref 96–112)
HDL: 67 mg/dL (ref >39)
LDL Cholesterol: 132 mg/dL — ABNORMAL HIGH (ref 0–99)
Sodium: 140 meq/L (ref 135–145)
Total CHOL/HDL Ratio: 3.5
Triglycerides: 175 mg/dL — ABNORMAL HIGH (ref <150)

## 2010-08-12 ENCOUNTER — Encounter: Admission: RE | Admit: 2010-08-12 | Discharge: 2010-08-12 | Payer: Self-pay | Admitting: Family Medicine

## 2010-09-02 ENCOUNTER — Emergency Department (HOSPITAL_COMMUNITY): Admission: EM | Admit: 2010-09-02 | Discharge: 2010-09-02 | Payer: Self-pay | Admitting: Family Medicine

## 2010-11-30 ENCOUNTER — Encounter: Payer: Self-pay | Admitting: Internal Medicine

## 2010-12-01 ENCOUNTER — Encounter: Payer: Self-pay | Admitting: Internal Medicine

## 2010-12-11 ENCOUNTER — Other Ambulatory Visit: Payer: Self-pay | Admitting: Family Medicine

## 2010-12-11 DIAGNOSIS — Z1239 Encounter for other screening for malignant neoplasm of breast: Secondary | ICD-10-CM

## 2010-12-12 NOTE — Letter (Signed)
Summary: Patient Notice- Polyp Results  [facility]  [address] [place], [place] [address]   Phone: [phone]  Fax: [phone]        December 27, 2009 MRN: [id number]    [facility] [address] [place], [place]  [address]    Dear Ms. Brenda Reyes,  I am pleased to inform you that the colon polyp(s) removed during your recent colonoscopy was (were) found to be benign (no cancer detected) upon pathologic examination.  I recommend you have a repeat colonoscopy examination in 5_ years to look for recurrent polyps, as having colon polyps increases your risk for having recurrent polyps or even colon cancer in the future.  Should you develop new or worsening symptoms of abdominal pain, bowel habit changes or bleeding from the rectum or bowels, please schedule an evaluation with either your primary care physician or with me.  Additional information/recommendations:  __ No further action with gastroenterology is needed at this time. Please      follow-up with your primary care physician for your other healthcare      needs.  __ Please call [phone] to schedule a return visit to review your      situation.  __ Please keep your follow-up visit as already scheduled.  __ Continue treatment plan as outlined the day of your exam.  Please call us if you are having persistent problems or have questions about your condition that have not been fully answered at this time.  Sincerely,  Louis Meckel MD  This letter has been electronically signed by your physician.  Appended Document: Patient Notice- Polyp Results Letter mailed 2.21.11

## 2010-12-12 NOTE — Progress Notes (Signed)
Summary: Changed Prep/No Insurance   Phone Note Outgoing Call Call back at [employer] [phone]   Call placed by: Merri Ray CMA Duncan Dull),  December 12, 2009 9:17 AM Summary of Call: Called pt to inform that [facility] does not carry the MoviPrep. We will have to switch her to the Miralax prep. She will be in tomorrow afternoon to pick up new instructions. Initial call taken by: Merri Ray CMA Duncan Dull),  December 12, 2009 9:18 AM  Follow-up for Phone Call        Printed new instructions and sent prep to [facility] Follow-up by: Merri Ray CMA Duncan Dull),  December 12, 2009 9:23 AM

## 2010-12-12 NOTE — Miscellaneous (Signed)
Summary: Lec previsit  Clinical Lists Changes  Medications: Added new medication of MOVIPREP 100 GM  SOLR (PEG-KCL-NACL-NASULF-NA ASC-C) As per prep instructions. - Signed Rx of MOVIPREP 100 GM  SOLR (PEG-KCL-NACL-NASULF-NA ASC-C) As per prep instructions.;  #1 x 0;  Signed;  Entered by: Ulis Rias RN;  Authorized by: Louis Meckel MD;  Method used: Faxed to [facility], [address], [place], [place]  [address], Ph: [phone], Fax: [phone]    Prescriptions: MOVIPREP 100 GM  SOLR (PEG-KCL-NACL-NASULF-NA ASC-C) As per prep instructions.  #1 x 0   Entered by:   Ulis Rias RN   Authorized by:   Louis Meckel MD   Signed by:   Ulis Rias RN on 12/11/2009   Method used:   Faxed to ...       [facility] (retail)       [address] [place], [place]  [address]       Ph: [phone]       Fax: [phone]   RxID:   [id number]

## 2010-12-12 NOTE — Progress Notes (Signed)
Summary: refill request  Medications Added ANUSOL-HC 25 MG SUPP (HYDROCORTISONE ACETATE) Use suppository twice daily as needed       Phone Note Call from Patient Call back at [phone]   Caller: Patient Call For: Dr. Arlyce Dice Reason for Call: Refill Medication Summary of Call: Hydrocortsone Aceta Supppositories, [facility] on [facility]. Initial call taken by: Vallarie Mare,  June 20, 2010 8:52 AM    New/Updated Medications: ANUSOL-HC 25 MG SUPP (HYDROCORTISONE ACETATE) Use suppository twice daily as needed Prescriptions: ANUSOL-HC 25 MG SUPP (HYDROCORTISONE ACETATE) Use suppository twice daily as needed  #28 x 0   Entered by:   Merri Ray CMA (AAMA)   Authorized by:   Louis Meckel MD   Signed by:   Merri Ray CMA (AAMA) on 06/20/2010   Method used:   Faxed to ...       [facility] (retail)       [address] [place], [place]  [address]       Ph: [phone]       Fax: [phone]   RxID:   [id number]

## 2010-12-12 NOTE — Progress Notes (Signed)
Summary: Paperwork for procedure   Phone Note Call from Patient Call back at Home Phone [phone]   Caller: Patient Call For: Dr. Arlyce Dice Summary of Call: Pt. is wanting to know if she needs to schedule another appt. to come in for her prep/paperwork. Initial call taken by: Karna Christmas,  December 17, 2009 2:46 PM  Follow-up for Phone Call        Called pt to inform that she did not need another appointment. She will be in tomorrow to see me to get her new instructions Follow-up by: Merri Ray CMA Duncan Dull),  December 17, 2009 2:54 PM

## 2010-12-12 NOTE — Progress Notes (Signed)
Summary: prep ?'s   Phone Note Call from Patient Call back at Home Phone [phone]   Caller: Patient Call For: Dr. Arlyce Dice Reason for Call: Talk to Nurse Summary of Call: has questions about the prep... COL tomorrow Initial call taken by: Vallarie Mare,  December 24, 2009 11:17 AM  Follow-up for Phone Call        Pt ate a small snickers bar with nuts and a small ear of corn and wanted to know if that would cause problems after reading instructions  to stop thiese types of foods 5 days before procedure.  Explained to her why we have those instructions and that we could still proceed with the procedure.  Instructed her to follow prep instructions and eat no solids today only clear liquids.  She verbalizes understanding.  Follow-up by: Laverna Peace RN,  December 24, 2009 11:28 AM

## 2010-12-12 NOTE — Letter (Signed)
Summary: [facility] Instructions  [facility]  [address] [place], [place] [address]   Phone: [phone]  Fax: [phone]       Brenda Reyes    01/17/1956    MRN: [id number]       Procedure Day /Date:TUESDAY 12/25/2009     Arrival Time:10AM     Procedure Time:11AM    Location of Procedure:                    [facility] (4th Floor)   PREPARATION FOR COLONOSCOPY WITH MIRALAX  Starting 5 days prior to your procedure 12/20/2009 do not eat nuts, seeds, popcorn, corn, beans, peas,  salads, or any raw vegetables.  Do not take any fiber supplements (e.g. Metamucil, Citrucel, and Benefiber). ____________________________________________________________________________________________________   THE DAY BEFORE YOUR PROCEDURE         DATE:12/24/2009 DAY: MONDAY  1   Drink clear liquids the entire day-NO SOLID FOOD  2   Do not drink anything colored red or purple.  Avoid juices with pulp.  No orange juice.  3   Drink at least 64 oz. (8 glasses) of fluid/clear liquids during the day to prevent dehydration and help the prep work efficiently.  CLEAR LIQUIDS INCLUDE: Water Jello Ice Popsicles Tea (sugar ok, no milk/cream) Powdered fruit flavored drinks Coffee (sugar ok, no milk/cream) Gatorade Juice: apple, white grape, white cranberry  Lemonade Clear bullion, consomm, broth Carbonated beverages (any kind) Strained chicken noodle soup Hard Candy  4   Mix the entire bottle of Miralax with 64 oz. of Gatorade/Powerade in the morning and put in the refrigerator to chill.  5   At 3:00 pm take 2 Dulcolax/Bisacodyl tablets.  6   At 4:30 pm take one Reglan/Metoclopramide tablet.  7  Starting at 5:00 pm drink one 8 oz glass of the Miralax mixture every 15-20 minutes until you have finished drinking the entire 64 oz.  You should finish drinking prep around 7:30 or 8:00 pm.  8   If you are nauseated, you may take the 2nd Reglan/Metoclopramide tablet at 6:30  pm.        9    At 8:00 pm take 2 more DULCOLAX/Bisacodyl tablets.     THE DAY OF YOUR PROCEDURE      DATE:  12/25/2009  DAY: Jake Shark  You may drink clear liquids until 9AM  (2 HOURS BEFORE PROCEDURE).   MEDICATION INSTRUCTIONS  Unless otherwise instructed, you should take regular prescription medications with a small sip of water as early as possible the morning of your procedure.  Additional medication instructions: DO NOT TAKE FLUID PILL AM OF PROCEDURE         OTHER INSTRUCTIONS  You will need a responsible adult at least 53 years of age to accompany you and drive you home.   This person must remain in the waiting room during your procedure.  Wear loose fitting clothing that is easily removed.  Leave jewelry and other valuables at home.  However, you may wish to bring a book to read or an iPod/MP3 player to listen to music as you wait for your procedure to start.  Remove all body piercing jewelry and leave at home.  Total time from sign-in until discharge is approximately 2-3 hours.  You should go home directly after your procedure and rest.  You can resume normal activities the day after your procedure.  The day of your procedure you should not:   Drive  Make legal decisions   Operate machinery   Drink alcohol   Return to work  You will receive specific instructions about eating, activities and medications before you leave.   The above instructions have been reviewed and explained to me by   ROBIN    I fully understand and can verbalize these instructions _____________________________ Date _______

## 2010-12-12 NOTE — Procedures (Signed)
Summary: Colonoscopy  Patient: [name] Note: All result statuses are Final unless otherwise noted.  Tests: (1) Colonoscopy (COL)   COL Colonoscopy           [facility]ONE      [facility]     [address]     [place], [place]  [address]           COLONOSCOPY PROCEDURE REPORT           PATIENT:  Brenda Reyes, Brenda Reyes  MR#:  [id number]     BIRTHDATE:  Mar 17, 1956, 53 yrs. old  GENDER:  female           ENDOSCOPIST:  Barbette Hair. Arlyce Dice, MD     Referred by:           PROCEDURE DATE:  12/25/2009     PROCEDURE:  Colonoscopy with snare polypectomy     ASA CLASS:  Class II     INDICATIONS:  Routine Risk Screening           MEDICATIONS:   Fentanyl 75 mcg IV, Versed 7 mg IV, Benadryl 50 mg     IV           DESCRIPTION OF PROCEDURE:   After the risks benefits and     alternatives of the procedure were thoroughly explained, informed     consent was obtained.  Digital rectal exam was performed and     revealed no abnormalities.   The LB [id number] endoscope     was introduced through the anus and advanced to the cecum, which     was identified by both the appendix and ileocecal valve, without     limitations.  The quality of the prep was good, using MoviPrep.     The instrument was then slowly withdrawn as the colon was fully     examined.     <<PROCEDUREIMAGES>>           FINDINGS:  A sessile polyp was found at the splenic flexure. It     was 4 mm in size. Polyp was snared without cautery. Retrieval was     successful (see image6). snare polyp  This was otherwise a normal     examination of the colon (see image1, image2, image3, image5,     image9, image10, and image14).  Internal hemorrhoids were found     (see image15).   Retroflexed views in the rectum revealed no     abnormalities.    The scope was then withdrawn from the patient     and the procedure completed.           COMPLICATIONS:  None           ENDOSCOPIC IMPRESSION:     1) 4 mm sessile polyp at the splenic  flexure     2) Internal hemorrhoids     3) Otherwise normal examination     RECOMMENDATIONS:     1) If the polyp(s) removed today are proven to be adenomatous     (pre-cancerous) polyps, you will need a repeat colonoscopy in 5     years. Otherwise you should continue to follow colorectal cancer     screening guidelines for "routine risk" patients with colonoscopy     in 10 years.           REPEAT EXAM:   You will receive a letter from Dr. Arlyce Dice in 1-2     weeks, after reviewing the final pathology, with followup  recommendations.           ______________________________     Barbette Hair Arlyce Dice, MD           CC:  Dow Adolph, MD           n.     [name]:   Barbette Hair. Darryn Kydd at 12/25/2009 12:59 PM           Little [place], [id number]  Note: An exclamation mark (!) indicates a result that was not dispersed into the flowsheet. Document Creation Date: 12/25/2009 1:00 PM _______________________________________________________________________  (1) Order result status: Final Collection or observation date-time: 12/25/2009 12:55 Requested date-time:  Receipt date-time:  Reported date-time:  Referring Physician:   Ordering Physician: Melvia Heaps [id number]) Specimen Source:  Source: Launa Grill Order Number: [id number] Lab site:   Appended Document: Colonoscopy     Procedures Next Due Date:    Colonoscopy: 12/2014

## 2010-12-12 NOTE — Progress Notes (Signed)
Summary: refill request  Medications Added ANUSOL-HC 25 MG SUPP (HYDROCORTISONE ACETATE) Use suppository twice daily as needed       Phone Note Call from Patient Call back at Home Phone [phone]   Caller: Patient Call For: Dr. Arlyce Dice Reason for Call: Talk to Nurse Summary of Call: would like a refill of suppositories... [facility] on [place]... pt states that her pharamcy told her they do not have a refill request from the 11th as EMR shows... pt asked that request be faxed again... pharmacy confirmed, it is correct Initial call taken by: Vallarie Mare,  June 26, 2010 9:04 AM  Follow-up for Phone Call        Med resent to pharmacy Follow-up by: Merri Ray CMA Duncan Dull),  June 26, 2010 9:28 AM    New/Updated Medications: ANUSOL-HC 25 MG SUPP (HYDROCORTISONE ACETATE) Use suppository twice daily as needed Prescriptions: ANUSOL-HC 25 MG SUPP (HYDROCORTISONE ACETATE) Use suppository twice daily as needed  #28 x 0   Entered by:   Merri Ray CMA (AAMA)   Authorized by:   Louis Meckel MD   Signed by:   Merri Ray CMA (AAMA) on 06/26/2010   Method used:   Faxed to ...       [facility] (retail)       [address] [place], [place]  [address]       Ph: [phone]       Fax: [phone]   RxID:   [id number]

## 2010-12-12 NOTE — Miscellaneous (Signed)
Summary: Office Visit ([facility])    Visit Type:  acute visit   History of Present Illness: Recently going through tough time. Father dying of CA.  Quit drinking 7 days ago.  Began smoking 3 months ago, quit 7 days ago.  Pt feeling that her tongue is swelling up and night and she has been waking up in sweats. Probably present since she began drinking heavily 3 months ago.  Sore throat noted yesterday. Rare dry cough. No congestion or rhinorrhea. Rare pain with inspiration in left chest. fevers and sweats going on since drinking.  Sister with multiple polyps, one year older than her.  Allergies: 1)  ! Maxzide   Review of Systems General:  Complains of fever and sweats; denies weight loss. ENT:  Denies difficulty swallowing. Resp:  Denies cough and shortness of breath. GI:  Denies abdominal pain, dark tarry stools, diarrhea, and vomiting.   Physical Exam  General:  Well-developed,well-nourished,in no acute distress; alert,appropriate and cooperative throughout examination Head:  Normocephalic and atraumatic without obvious abnormalities. No apparent alopecia or balding. Eyes:  No corneal or conjunctival inflammation noted. EOMI. Perrla.  Ears:  External ear exam shows no significant lesions or deformities.  Otoscopic examination reveals clear canals, tympanic membranes are intact bilaterally without bulging, retraction, inflammation or discharge. Hearing is grossly normal bilaterally. Nose:  External nasal examination shows no deformity or inflammation. Nasal mucosa are pink and moist without lesions. +clear discharge. Mouth:  Oral mucosa and oropharynx without lesions or exudates. Mild posterior pharyngeal erythema without exudate, +PND. Lungs:  Normal respiratory effort, chest expands symmetrically. Lungs are clear to auscultation, no crackles or wheezes. Heart:  Normal rate and regular rhythm. S1 and S2 normal without gallop, murmur, click, rub or other extra  sounds. Abdomen:  Bowel sounds positive,abdomen soft and non-tender without masses, organomegaly or hernias noted.   Impression & Recommendations:  Problem # 1:  URI (ICD-465.9) Symptomatic care. RTC if not improved at 2 weeks or if worsens.  Problem # 2:  HYPERTENSION, ESSENTIAL NOS (ICD-401.9) Pt states going through a lot and quitting smoking and drinking has made her BP go up.  She is to check as outpatient  Her updated medication list for this problem includes:    Lisinopril-hydrochlorothiazide 20-25 Mg Tabs (Lisinopril-hydrochlorothiazide) .Marland Kitchen... 1 by mouth once daily    Toprol Xl 100 Mg Tb24 (Metoprolol succinate) ..... 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  Problem # 4:  TOBACCO USER (ICD-305.1) Counselled continued abstinence.  Complete Medication List: 1)  Lisinopril-hydrochlorothiazide 20-25 Mg Tabs (Lisinopril-hydrochlorothiazide) .Marland Kitchen.. 1 by mouth once daily 2)  Toprol Xl 100 Mg Tb24 (Metoprolol succinate) .... By mouth once daily  ]

## 2010-12-12 NOTE — Miscellaneous (Signed)
Summary: Miralax prep  Clinical Lists Changes  Medications: Changed medication from MOVIPREP 100 GM  SOLR (PEG-KCL-NACL-NASULF-NA ASC-C) As per prep instructions. to MIRALAX   POWD (POLYETHYLENE GLYCOL 3350) As per prep  instructions. - Signed Added new medication of REGLAN 10 MG  TABS (METOCLOPRAMIDE HCL) As per prep instructions. - Signed Added new medication of DULCOLAX 5 MG  TBEC (BISACODYL) Day before procedure take 2 at 3pm and 2 at 8pm. - Signed Rx of MIRALAX   POWD (POLYETHYLENE GLYCOL 3350) As per prep  instructions.;  #255gm x 0;  Signed;  Entered by: Merri Ray CMA (AAMA);  Authorized by: Louis Meckel MD;  Method used: Faxed to [facility], [address], [place], [place]  [address], Ph: [phone], Fax: [phone] Rx of REGLAN 10 MG  TABS (METOCLOPRAMIDE HCL) As per prep instructions.;  #2 x 0;  Signed;  Entered by: Merri Ray CMA (AAMA);  Authorized by: Louis Meckel MD;  Method used: Faxed to [facility], [address], [place], [place]  [address], Ph: [phone], Fax: [phone] Rx of DULCOLAX 5 MG  TBEC (BISACODYL) Day before procedure take 2 at 3pm and 2 at 8pm.;  #4 x 0;  Signed;  Entered by: Merri Ray CMA (AAMA);  Authorized by: Louis Meckel MD;  Method used: Faxed to [facility], [address], [place], [place]  [address], Ph: [phone], Fax: [phone]    Prescriptions: DULCOLAX 5 MG  TBEC (BISACODYL) Day before procedure take 2 at 3pm and 2 at 8pm.  #4 x 0   Entered by:   Merri Ray CMA (AAMA)   Authorized by:   Louis Meckel MD   Signed by:   Merri Ray CMA (AAMA) on 12/12/2009   Method used:   Faxed to ...       [facility] (retail)       [address] [place], [place]  [address]       Ph: [phone]       Fax: [phone]   RxID:   [id number] REGLAN  10 MG  TABS (METOCLOPRAMIDE HCL) As per prep instructions.  #2 x 0   Entered by:   Merri Ray CMA (AAMA)   Authorized by:   Louis Meckel MD   Signed by:   Merri Ray CMA (AAMA) on 12/12/2009   Method used:   Faxed to ...       [facility] (retail)       [address] [place], [place]  [address]       Ph: [phone]       Fax: [phone]   RxID:   [id number] MIRALAX   POWD (POLYETHYLENE GLYCOL 3350) As per prep  instructions.  #255gm x 0   Entered by:   Merri Ray CMA (AAMA)   Authorized by:   Louis Meckel MD   Signed by:   Merri Ray CMA (AAMA) on 12/12/2009   Method used:   Faxed to ...       [facility] (retail)       [address] [place], [place]  [address]       Ph: [phone]       Fax: [phone]   RxID:   [id number]

## 2010-12-12 NOTE — Progress Notes (Signed)
Summary: Triage--hemorrhoids   Phone Note Call from Patient Call back at Home Phone [phone]   Caller: Patient Call For: Dr. Arlyce Dice Reason for Call: Talk to Nurse Summary of Call: since COL, pt says hemorrhoids are flared Initial call taken by: Vallarie Mare,  February 08, 2010 10:25 AM  Follow-up for Phone Call        Pt. c/o inflammed hemorrhoids with rectal pain, blood on tissue after a BM, very painful to push them back into rectum. Pain is becomming severe. Pt. will see Willette Cluster NP today at 3pm. Follow-up by: Laureen Ochs LPN,  February 08, 2010 11:05 AM

## 2010-12-12 NOTE — Progress Notes (Signed)
Summary: hemorrhoids   Phone Note Call from Patient Call back at Home Phone [phone]   Caller: Patient Call For: Willette Cluster Reason for Call: Talk to Nurse Summary of Call: continued hemorrhoid pain... pt says she needs "something" for the pain Initial call taken by: Vallarie Mare,  February 20, 2010 3:43 PM  Follow-up for Phone Call        Pt said she has pain in her rectal area and also rt groin area where her leg  connects to her pelvis.  She said she has no bleeding since using the Suppositories. I asked the pt if she lifted anything heavy today and she said she did lift her 60 LB dog to bath him.  She didn't think about it until after she did it and that is when the severe pain started.  Gunnar Fusi suggested she go to Urgent care or ER to have this pain and strained area checked out.  She should continue the suppositories and call us if hemorrhoid situation worsens.  I had to leave message for pt at 4:50PM.  I advised pt we have an MD on call after 5PM.   Follow-up by: Joselyn Glassman,  February 20, 2010 4:52 PM

## 2010-12-12 NOTE — Letter (Signed)
Summary: [facility] Instructions  [facility]  [address] [place], [place] [address]   Phone: [phone]  Fax: [phone]       Brenda Reyes    [birth year]    MRN: [id number]        Procedure Day /Date:  Tuesday 12/25/2009     Arrival Time: 10:00 am      Procedure Time:  11:00 am     Location of Procedure:                    _x _  [facility] (4th Floor)                        PREPARATION FOR COLONOSCOPY WITH MOVIPREP   Starting 5 days prior to your procedure thursday 2/10 do not eat nuts, seeds, popcorn, corn, beans, peas,  salads, or any raw vegetables.  Do not take any fiber supplements (e.g. Metamucil, Citrucel, and Benefiber).  THE DAY BEFORE YOUR PROCEDURE         DATE: Monday 2/14 1.  Drink clear liquids the entire day-NO SOLID FOOD  2.  Do not drink anything colored red or purple.  Avoid juices with pulp.  No orange juice.  3.  Drink at least 64 oz. (8 glasses) of fluid/clear liquids during the day to prevent dehydration and help the prep work efficiently.  CLEAR LIQUIDS INCLUDE: Water Jello Ice Popsicles Tea (sugar ok, no milk/cream) Powdered fruit flavored drinks Coffee (sugar ok, no milk/cream) Gatorade Juice: apple, white grape, white cranberry  Lemonade Clear bullion, consomm, broth Carbonated beverages (any kind) Strained chicken noodle soup Hard Candy                             4.  In the morning, mix first dose of MoviPrep solution:    Empty 1 Pouch A and 1 Pouch B into the disposable container    Add lukewarm drinking water to the top line of the container. Mix to dissolve    Refrigerate (mixed solution should be used within 24 hrs)  5.  Begin drinking the prep at 5:00 p.m. The MoviPrep container is divided by 4 marks.   Every 15 minutes drink the solution down to the next mark (approximately 8 oz) until the full liter is complete.   6.  Follow completed prep with 16 oz of clear liquid of your choice (Nothing  red or purple).  Continue to drink clear liquids until bedtime.  7.  Before going to bed, mix second dose of MoviPrep solution:    Empty 1 Pouch A and 1 Pouch B into the disposable container    Add lukewarm drinking water to the top line of the container. Mix to dissolve    Refrigerate  THE DAY OF YOUR PROCEDURE      DATE: Tuesday 2/15  Beginning at 6:00 am (5 hours before procedure):         1. Every 15 minutes, drink the solution down to the next mark (approx 8 oz) until the full liter is complete.  2. Follow completed prep with 16 oz. of clear liquid of your choice.    3. You may drink clear liquids until 9:00 am (2 HOURS BEFORE PROCEDURE).   MEDICATION INSTRUCTIONS  Unless otherwise instructed, you should take regular prescription medications with a small sip of water   as early as possible the morning of  your procedure.   Additional medication instructions: Do not take fluid pill am of procedure         OTHER INSTRUCTIONS  You will need a responsible adult at least 53 years of age to accompany you and drive you home.   This person must remain in the waiting room during your procedure.  Wear loose fitting clothing that is easily removed.  Leave jewelry and other valuables at home.  However, you may wish to bring a book to read or  an iPod/MP3 player to listen to music as you wait for your procedure to start.  Remove all body piercing jewelry and leave at home.  Total time from sign-in until discharge is approximately 2-3 hours.  You should go home directly after your procedure and rest.  You can resume normal activities the  day after your procedure.  The day of your procedure you should not:   Drive   Make legal decisions   Operate machinery   Drink alcohol   Return to work  You will receive specific instructions about eating, activities and medications before you leave.    The above instructions have been reviewed and explained to me by  Ulis Rias RN  December 11, 2009 2:31 PM    I fully understand and can verbalize these instructions _____________________________ Date _________

## 2010-12-12 NOTE — Procedures (Signed)
Summary: Prep/[facility]  Prep/[facility]   Imported By: Lester Gentry 12/28/2009 08:19:34  _____________________________________________________________________  External Attachment:    Type:   Image     Comment:   External Document

## 2010-12-12 NOTE — Assessment & Plan Note (Signed)
Summary: WORSENING HEMORRHOIDS/PAIN        (DR.KAPLAN PT.)     Brenda Reyes    History of Present Illness Visit Type: Follow-up Visit Primary GI MD: Melvia Heaps MD [facility] Primary Provider: Syliva Overman, MD Chief Complaint: hemmorhoids History of Present Illness:   Patient recently saw Dr. Arlyce Dice and had a colonoscopy for routine screening. Here for hemorrhoidal pain today. She had hemorrhoids "stapled" several years ago. She continues to have intermittent flares. Occasional blood on tissue. She has rectal pain just after defecation. Using Prep H.  She is not constipated, has one soft stool a day. No heavy lifting or prolonged sitting.   GI Review of Systems      Denies abdominal pain, acid reflux, belching, bloating, chest pain, dysphagia with liquids, dysphagia with solids, heartburn, loss of appetite, nausea, vomiting, vomiting blood, weight loss, and  weight gain.      Reports hemorrhoids, rectal bleeding, and  rectal pain.     Denies anal fissure, black tarry stools, change in bowel habit, constipation, diarrhea, diverticulosis, fecal incontinence, heme positive stool, irritable bowel syndrome, jaundice, light color stool, and  liver problems. Preventive Screening-Counseling & Management  Alcohol-Tobacco     Smoking Status: quit  Caffeine-Diet-Exercise     Does Patient Exercise: yes      Drug Use:  no.      Current Medications (verified): 1)  Lisinopril-Hydrochlorothiazide 20-25 Mg Tabs (Lisinopril-Hydrochlorothiazide) .Marland Kitchen.. 1 By Mouth Once Daily 2)  Toprol Xl 100 Mg Tb24 (Metoprolol Succinate) .... By Mouth Once Daily 3)  Norvasc 5 Mg Tabs (Amlodipine Besylate) .... Once Daily 4)  Clonidine Hcl 0.1 Mg Tabs (Clonidine Hcl) .... As Needed  Allergies (verified): 1)  ! Maxzide  Past History:  Past Medical History: HTN. Hemorrhoids  Past Surgical History: Tubal Ligation Hysterectomy  Family History: Lung Cancer: Father Family History of Diabetes: Mother Family History of  Heart Disease: Mother Family History Hypertension: Siblings  Social History: Occupation: Unemployed Patient is a former smoker.  Alcohol Use - yes Illicit Drug Use - no Patient gets regular exercise. Smoking Status:  quit Drug Use:  no Does Patient Exercise:  yes  Vital Signs:  Patient profile:   55 year old female Height:      67 inches Weight:      154.38 pounds BMI:     24.27 Pulse rate:   72 / minute Pulse rhythm:   regular BP sitting:   144 / 90  (left arm) Cuff size:   regular  Vitals Entered By: June McMurray CMA Duncan Dull) (February 08, 2010 3:00 PM)  Physical Exam  General:  Well developed, well nourished, no acute distress. Abdomen:  Abdomen soft, nontender, nondistended. No obvious masses or hepatomegaly.Normal bowel sounds.  Rectal:  Inflamed hemorrhoids, all reducible. No fissures   Impression & Recommendations:  Problem # 1:  HEMORRHOIDS (ICD-455.6) Assessment Deteriorated Large hemorrhoids on exam, all reducible. Internal hemorrhoids on recent colonoscopy. Start Anusol HC suppositories two times a day for 14 days. If no improvement, call our office back.  She may need surgical evaluation if no improvment. She is not constipated.  Patient Instructions: 1)  We faxed a perscription for the pharmacy-[facility]. 2)  Avoid straining and constipation. 3)  Do no heavy lifting. 4)  If your symptoms have not improved in 2 weeks please call Pam at [phone] and she will talk to Willette Cluster NP. 5)  The medication list was reviewed and reconciled.  All changed / newly prescribed medications  were explained.  A complete medication list was provided to the patient / caregiver. Prescriptions: ANUSOL-HC 25 MG SUPP (HYDROCORTISONE ACETATE) Use suppository twice daily x 14 days  #28 x 0   Entered by:   Lowry Ram NCMA   Authorized by:   Willette Cluster NP   Signed by:   Lowry Ram NCMA on 02/08/2010   Method used:   Printed then faxed to ...        [facility] (retail)       [address] [place], [place]  [address]       Ph: [phone]       Fax: [phone]   RxID:   [id number]

## 2010-12-31 ENCOUNTER — Ambulatory Visit
Admission: RE | Admit: 2010-12-31 | Discharge: 2010-12-31 | Disposition: A | Discharge status: Home / Self Care | Payer: Self-pay | Source: Ambulatory Visit | Attending: Family Medicine | Admitting: Family Medicine

## 2010-12-31 ENCOUNTER — Other Ambulatory Visit: Payer: Self-pay | Admitting: Family Medicine

## 2010-12-31 DIAGNOSIS — Z1239 Encounter for other screening for malignant neoplasm of breast: Secondary | ICD-10-CM

## 2011-01-22 LAB — POCT URINALYSIS DIPSTICK
Glucose, UA: NEGATIVE mg/dL
Hgb urine dipstick: NEGATIVE
Nitrite: NEGATIVE
Protein, ur: NEGATIVE mg/dL
Specific Gravity, Urine: 1.025 (ref 1.005–1.030)
Urobilinogen, UA: 0.2 mg/dL (ref 0.0–1.0)

## 2011-01-22 LAB — GC/CHLAMYDIA PROBE AMP, GENITAL: GC Probe Amp, Genital: NEGATIVE

## 2011-01-22 LAB — WET PREP, GENITAL: Clue Cells Wet Prep HPF POC: NONE SEEN

## 2011-01-22 LAB — HEMOCCULT GUIAC POC 1CARD (OFFICE): Fecal Occult Bld: NEGATIVE

## 2011-01-23 LAB — POCT URINALYSIS DIPSTICK
Bilirubin Urine: NEGATIVE
Glucose, UA: NEGATIVE mg/dL
Ketones, ur: NEGATIVE mg/dL
Nitrite: NEGATIVE
pH: 5.5 (ref 5.0–8.0)

## 2011-01-23 LAB — URINALYSIS, ROUTINE W REFLEX MICROSCOPIC
Nitrite: NEGATIVE
Specific Gravity, Urine: 1.021 (ref 1.005–1.030)
Urobilinogen, UA: 0.2 mg/dL (ref 0.0–1.0)
pH: 6 (ref 5.0–8.0)

## 2011-01-23 LAB — COMPREHENSIVE METABOLIC PANEL
ALT: 17 U/L (ref 0–35)
AST: 25 U/L (ref 0–37)
Albumin: 3.9 g/dL (ref 3.5–5.2)
CO2: 29 mEq/L (ref 19–32)
Chloride: 103 mEq/L (ref 96–112)
Creatinine, Ser: 1.09 mg/dL (ref 0.4–1.2)
GFR calc Af Amer: >60 mL/min (ref >60)
Potassium: 3 mEq/L — ABNORMAL LOW (ref 3.5–5.1)
Sodium: 138 mEq/L (ref 135–145)
Total Bilirubin: 0.3 mg/dL (ref 0.3–1.2)

## 2011-01-23 LAB — CBC
Hemoglobin: 12.7 g/dL (ref 12.0–15.0)
MCH: 31.1 pg (ref 26.0–34.0)
Platelets: 279 10*3/uL (ref 150–400)
RBC: 4.09 MIL/uL (ref 3.87–5.11)
WBC: 5.8 10*3/uL (ref 4.0–10.5)

## 2011-01-23 LAB — DIFFERENTIAL
Basophils Absolute: 0 10*3/uL (ref 0.0–0.1)
Eosinophils Absolute: 0.2 10*3/uL (ref 0.0–0.7)
Eosinophils Relative: 3 % (ref 0–5)
Lymphocytes Relative: 40 % (ref 12–46)
Monocytes Absolute: 0.6 10*3/uL (ref 0.1–1.0)

## 2011-01-26 LAB — POCT URINALYSIS DIP (DEVICE)
Hgb urine dipstick: NEGATIVE
Nitrite: NEGATIVE
Protein, ur: NEGATIVE mg/dL
Specific Gravity, Urine: 1.01 (ref 1.005–1.030)
Urobilinogen, UA: 0.2 mg/dL (ref 0.0–1.0)

## 2011-03-25 NOTE — Group Therapy Note (Signed)
NAMEIDELL, HISSONG NO.:  [id number]   MEDICAL RECORD NO.:  [id number]          PATIENT TYPE:  WOC   LOCATION:  [facility]                   FACILITY:  [facility]   PHYSICIAN:  Argentina Donovan, MD        DATE OF BIRTH:  [birth year]   DATE OF SERVICE:  10/28/2007                                  CLINIC NOTE   The patient is a 55 year old African American female who underwent total  abdominal hysterectomy in December 2007, presented December 1 with acute  onset of left lower quadrant pain to the maternity admissions office.  Ultrasound revealed a left adnexal mass with no normal surrounding  ovarian tissue or separate left ovary.  The mass measured 3x7x3x4x3x7  cm.  A right ovary was normal.  The thought was this is probably  hemorrhagic cyst.  The pain has been has gone away.  The patient has had  no further pain.  We are following her up with an ultrasound around  January 15, i.e., 6 weeks from the original and we will call her with  results of that.   IMPRESSION:  Hemorrhagic ovarian cyst.           ______________________________  Argentina Donovan, MD     PR/MEDQ  D:  10/28/2007  T:  10/29/2007  Job:  [id number]

## 2011-03-25 NOTE — Group Therapy Note (Signed)
NAMELARON, ANGELINI               ACCOUNT NO.:  [id number]   MEDICAL RECORD NO.:  [id number]          PATIENT TYPE:  WOC   LOCATION:  [facility]                   FACILITY:  [facility]   PHYSICIAN:  Karlton Lemon, MD      DATE OF BIRTH:  11/30/1955   DATE OF SERVICE:  02/17/2008                                  CLINIC NOTE   CHIEF COMPLAINT:  Pelvic pressure with urination.   HISTORY OF PRESENT ILLNESS:  This is a 55 year old gravida 4, para 3-0-1-  3 presenting with pelvic pain and pressure with urination for the past 4  months.  She has a history of total abdominal hysterectomy performed by  Dr. Okey Dupre in November of 2007.  She states that since December of 2008  she has had this pelvic pain with urination.  She was initially  evaluated with ultrasound showing a left adnexal mass.  On followup  ultrasound one month later, there was resolution of the complex left  adnexal mass, suggesting that this was a hemorrhagic ovarian cyst.  The  patient has had noted two episodes of pain that were not with urination,  one in the left flank and one in the right flank.  Mostly her pain is in  the lower abdomen and is a pressure-like pain when she urinates.  She  notes no hematuria.  She denies and diarrhea but occasionally has some  constipation.   PAST MEDICAL HISTORY:  Hypertension.   OBSTETRICAL HISTORY:  The patient is gravida 4, para 3-0-1-3.   MEDICATIONS:  1. Claritin 10 mg 1 tablet p.o. at night.  2. Lisinopril and hydrochlorothiazide 20/12.5 one p.o. b.i.d.  3. Diovan 325 mg one daily.  4. Toprol-XL 100 mg 2 tablets daily.   ALLERGIES:  NO KNOWN DRUG ALLERGIES.   FAMILY HISTORY:  The patient has mother with diabetes and high blood  pressure.   PHYSICAL EXAMINATION:  GENERAL:  Well-appearing female in no distress.  VITAL SIGNS:  Temperature 97.5, respirations 18, pulse 64, blood  pressure 148/101, weight 153.4, height 5 feet 7 inches.  CARDIOVASCULAR:  Regular rate and rhythm.  No  murmurs, rubs or gallops.  CHEST:  Lungs clear to auscultation bilaterally.  ABDOMEN:  Soft, nontender to palpation.  Positive bowel sounds in all  four quadrants.  No mass palpable.  GENITOURINARY:  Normal female external genitalia with some variation in  pigmentation around the vaginal introitus.  The pigmentation is a  blackish coloration.  She has a small, probably 0.5-cm cystic-feeling  mass at a little past 6 o'clock around the introitus.  Vaginal mucosa is  otherwise pink and moist.  On bimanual examination, the uterus is  absent.  No adnexal mass can be palpated, though adnexa is slightly  difficult to palpate secondary to body habitus.  There is no tenderness  with palpation of the bladder or rectum.   ASSESSMENT AND PLAN:  This is a 55 year old gravida 4, para 3-0-1-3 with  pelvic pain associated with urination.  This could represent  interstitial cystitis.  She has been counseled on diet changes she needs  to make including decreasing to eliminating alcohol,  caffeine, spicy  foods, tomatoes from her diet.  The patient agrees to attempt these diet  changes.  Of note, the patient has also had recent ultrasounds.  No  abnormalities noted on the physical examination today.  Because her  symptoms are clearly with urination only, will monitor the symptoms and  follow in 2 months to see if these diet changes help.  In 2 months, the  pigmentation around the vaginal introitus should be reassessed.  The  patient has been counseled about this.           ______________________________  Karlton Lemon, MD     NS/MEDQ  D:  02/17/2008  T:  02/17/2008  Job:  [id number]

## 2011-03-28 NOTE — Group Therapy Note (Signed)
Brenda Reyes, TEP NO.:  [id number]   MEDICAL RECORD NO.:  [id number]          PATIENT TYPE:  WOC   LOCATION:  [facility]                   FACILITY:  [facility]   PHYSICIAN:  Dorthula Perfect, MD     DATE OF BIRTH:  26-Dec-1955   DATE OF SERVICE:                                    CLINIC NOTE   This 55 year old-black-female multigravida underwent abdominal hysterectomy  approximately 2 weeks ago by Dr. Okey Dupre.   DIAGNOSES:  Leiomyomas uteri and abnormal uterine bleeding.  She was in the  hospital for 3 days.   She is doing well.  Her only problem is a lot of gas and abdominal  cramping.  She is here for post-op check.   EXAM:  ABDOMEN:  Flat and soft, and pretty much nontender.  Steri-Strips are  removed.  The incision is low-transverse, and is healing really well, and  with a very fine scar.  PELVIC:  Not done.   DIAGNOSES:  Normal surgical postoperative check.   DISPOSITION:  1. Repeat hematocrit and hemoglobin.  The patient is on iron.  Her last      H&H on November 8 was 7.2 and 22.1 respectively.  2. The patient will increase her ambulation and I have encouraged her to      walk outdoors on a daily basis.  3. No heavy lifting.  4. No sexual intercourse.  5. She will be seen here again in 4 weeks.  6. Prescription for Percocet 5/325 tablets 30 with no refill.           ______________________________  Dorthula Perfect, MD     ER/MEDQ  D:  09/30/2006  T:  09/30/2006  Job:  [id number]

## 2011-03-28 NOTE — Op Note (Signed)
Brenda Reyes, Brenda Reyes               ACCOUNT NO.:  [id number]   MEDICAL RECORD NO.:  [id number]          PATIENT TYPE:  AMB   LOCATION:  DAY                          FACILITY:  [facility]   PHYSICIAN:  Ollen Gross. Vernell Morgans, M.D. DATE OF BIRTH:  1956-01-10   DATE OF PROCEDURE:  12/27/2004  DATE OF DISCHARGE:  12/27/2004                                 OPERATIVE REPORT   PREOPERATIVE DIAGNOSIS:  Internal and external hemorrhoids.   POSTOPERATIVE DIAGNOSES:  Internal and external hemorrhoids.   PROCEDURE:  PPH type hemorrhoidectomy.   SURGEON:  Ollen Gross. Carolynne Edouard, M.D.   ANESTHESIA:  General endotracheal.   PROCEDURE:  After informed consent was obtained, the patient was brought to  the operating room, left in the supine position on the bed.  After induction  of general anesthesia, the patient was moved into a prone position on the  operating room table and all pressure points were padded. The patient was  placed in a sort of jackknife position. The buttocks were retracted  laterally with tape. The perirectal area was then prepped with Betadine and  draped in the usual sterile manner. The patient had large internal and  external hemorrhoids with prolapse. These hemorrhoids were injected with  0.25% Marcaine with epinephrine and 1 cc Wydase.  The tissue was massaged  gently for several minutes and then another 10 cc of Marcaine was  infiltrated in the area. A deep Fansler retractor was placed in the rectum,  a 2-0 Prolene pursestring stitch was placed in the mucosa and submucosal at  a depth of about 4-5 cm deep to the dentate line. Care was taken to make  each stitch start where the last stitch ended and only to gather up mucosa  and submucosa. Once this pursestring was in en circumferentially,  the ends  of the pursestring stitch were brought through a clear plastic retractor and  white dilator. The retractor and dilator were placed within the rectum. The  white dilator was then removed,  the PPH  stapling device was then placed  within the rectum until it was felt to drop beneath the pursestring stitch.  The pursestring stitch was then cinched down and tied. The [facility] stapler was  then advanced into the rectum and was screwed down as tight as it would go.  A minute was allowed to pass, the stapling device was then fired and another  minute was allowed to pass. The stapling device was then unscrewed  approximately a half turn and removed from the rectum without difficulty.  The internal hemorrhoidal tissue that was excised was examined and appeared  to be very uniform with a couple of different areas of large hemorrhoidal  tissue. No muscle was identified and the specimen was then sent to pathology  for further evaluation. A normal Fansler retractor was then placed within  the rectum, two small bleeding points were controlled with figure-of-eight 4-  0 Vicryl stitches. The rest of the staple line was completely hemostatic and  looked very good.  Lidocaine jelly was  then placed in the rectum with a small piece of  Gelfoam and sterile  dressings were applied. The patient tolerated well. At the end of the case,  all needle, sponge and instrument counts were correct.  The patient was then  awakened, taken recovery in stable condition.      PST/MEDQ  D:  12/30/2004  T:  12/30/2004  Job:  [id number]

## 2011-03-28 NOTE — Discharge Summary (Signed)
[name], [name]               ACCOUNT NO.:  [id number]   MEDICAL RECORD NO.:  [id number]          PATIENT TYPE:  INP   LOCATION:  9315                          [facility]   PHYSICIAN:  Phil D. Okey Dupre, M.D.     DATE OF BIRTH:  November 04, 1956   DATE OF ADMISSION:  09/14/2006  DATE OF DISCHARGE:  09/17/2006                                 DISCHARGE SUMMARY   Patient is a 55 year old multiparous African-American lady with symptomatic  fibroids, who underwent total abdominal hysterectomy on the day of admission  and has had an unremarkable postoperative course, been afebrile during the  entire course.   On the day of discharge, the abdomen was soft, flat, minimally tender with  clean wound and no sign of inflammation, infection, or hematoma, and active  bowel sounds.  The patient is passing flatus and voiding well.  Extremities  are negative.  Lungs are clear to auscultation and percussion.   Patient will be seen in followup in two weeks in the GYN clinic.  She was  given extensive instructions as to activity, especially related to lifting,  stairs, and driving.  Diet, especially regarding increasing fiber and  supplementing stool softeners if necessary.   DISCHARGE MEDICATIONS:  1. Motrin, which she will take regularly, 600 mg q.6h. with food.  2. Percocet supplemental for pain, 40 tablets at 5 mg.  3. Slow FE.   SIGNIFICANT LABS:  Her admitting hemoglobin was 8.8 with 26.3 hematocrit.  Her discharge hemoglobin is 7.2 with a 22 hematocrit.  White count on  discharge is 7.7.  Her pathology has not yet been completed by pathologist,  but I suspect no significant finding outside of the large uterus.   DISCHARGE DIAGNOSIS:  Satisfactory progress following total abdominal  hysterectomy.           ______________________________  Javier Glazier Okey Dupre, M.D.     PDR/MEDQ  D:  09/17/2006  T:  09/17/2006  Job:  [id number]

## 2011-03-28 NOTE — Group Therapy Note (Signed)
Brenda Reyes, Brenda Reyes               ACCOUNT NO.:  [id number]   MEDICAL RECORD NO.:  [id number]          PATIENT TYPE:  WOC   LOCATION:  [facility]                   FACILITY:  [facility]   PHYSICIAN:  Dorthula Perfect, MD     DATE OF BIRTH:  [birth year]   DATE OF SERVICE:                                  CLINIC NOTE   A 55 year old-black-female multigravida returns 6 weeks post  hysterectomy.  She is having mid and low back pain.  She is having  urinary frequency and a lot of pressure-like symptoms.  She has no  dysuria.   EXAM:  Her abdomen is flat and soft, and nontender.  Her incision is  well healed.  PELVIC:  The vaginal vault was epithelialized.  The vaginal cuff appears  to be well-healed.  There is no cuff tenderness.  Ovaries are not felt.  Her left paraspinous back muscles are really tense and somewhat tender.   DIAGNOSES:  1. Low back pain.  2. Normal surgical postoperative check.  3. Rule out urinary tract infection.   DISPOSITION:  1. The patient will finish up her iron.  2. Ibuprofen or Aleve for the low back pain.  3. Clean-catch urine for routine and culture.           ______________________________  Dorthula Perfect, MD     ER/MEDQ  D:  10/28/2006  T:  10/28/2006  Job:  [id number]

## 2011-03-28 NOTE — Op Note (Signed)
NAMECORA, BRIERLEY               ACCOUNT NO.:  [id number]   MEDICAL RECORD NO.:  [id number]          PATIENT TYPE:  INP   LOCATION:  9315                          [facility]ILITY:  [facility]   PHYSICIAN:  Tanya S. Shawnie Pons, M.D.   DATE OF BIRTH:  09-Sep-1956   DATE OF PROCEDURE:  09/14/2006  DATE OF DISCHARGE:                                 OPERATIVE REPORT   PREOPERATIVE DIAGNOSES:  1. Fibroid uterus.  2. Dysmenorrhea.  3. Menorrhagia.   POSTOPERATIVE DIAGNOSES:  1. Fibroid uterus.  2. Dysmenorrhea.  3. Menorrhagia.   OPERATIONS:  TAH.   SURGEON:  Javier Glazier. Okey Dupre, M.D.   ASSISTANT:  Shelbie Proctor. Shawnie Pons, M.D.   ANESTHESIA:  General.   SPECIMENS:  Uterus plus cervix.   ESTIMATED BLOOD LOSS:  300 cc.   COMPLICATIONS:  None.   REASON FOR PROCEDURE:  The patient is a 55 year old gravida 4, para 3 who  had symptomatic fibroid uterus with abnormal uterine bleeding, anemia and  pelvic pressure.  The patient was scheduled for an abdominal hysterectomy.   DESCRIPTION OF PROCEDURE:  The patient was taken to the OR where she was  placed in the supine position.  She was prepped and draped in the usual  sterile fashion.  A Foley catheter was inserted into the vagina.  Exam under  anesthesia was performed.  The uterus was noted to be about 16 to 18 week  size.  A Pfannenstiel incision was made in the skin and carried down to the  underlying fascia which was incised transversely sharply.  The superior and  inferior edges of the fascia were then taken off of the underlying rectus  bluntly laterally and sharply in the midline.  The rectus was then  separated.  The peritoneal cavity entered sharply.  The uterus was grasped  with a towel clip and brought up and out of the incision.  The rounds were  identified and suture ligated.  The bladder flap was created and the bladder  was brought down off the cervix.  Tubo-ovarian pedicles were then  identified, grasped with Heaney clamp and cut.  A free tie plus a  suture  ligature was used to assure hemostasis.  The uterine arteries were then  skeletonized, grasped with Heaney clamps, cut and suture ligated.  This was  done bilaterally.  Straight clamps were then used to go down the cervix  sequentially with a bite, cut with a suture ligature.  Once we were down far  enough on the cervix, the vagina was cut until the vagina was entered and  the cervix could be visualized and the cervix was removed.  The edges of the  vagina were then tagged to the corner pedicle with the uterosacral ligament.  The vagina was closed with a locked running suture.  Two or three figure-of-  eight had to be used to control bleeding along the vaginal cuff, especially  along the patient's right angle as well as the posterior cuff which was  bleeding mildly.  A suture ligature was used to stop this.  The ovaries were  again inspected and  found to be hemostatic.  The vaginal cuff was inspected  and also found to be hemostatic.  All instruments were removed from the  abdomen.  The fascia was closed with a 0 Vicryl suture in a running fashion.  Any bleeders in the subcu were cauterized and the skin closed using clips.  A pressure dressing was applied.  All instrument, needle and lap counts were  correct x2.  The patient was awakened and taken to recovery in stable  condition.           ______________________________  Shelbie Proctor Shawnie Pons, M.D.     TSP/MEDQ  D:  09/14/2006  T:  09/15/2006  Job:  [id number]

## 2011-03-28 NOTE — Group Therapy Note (Signed)
Brenda Reyes, TISON NO.:  [id number]   MEDICAL RECORD NO.:  [id number]          PATIENT TYPE:  WOC   LOCATION:  [facility]                   FACILITY:  [facility]   PHYSICIAN:  Dorthula Perfect, MD     DATE OF BIRTH:  28-Jan-1956   DATE OF SERVICE:  06/04/2006                                    CLINIC NOTE   This 55 year old black female, gravida 4, para 3, aborta 1.  Last menstrual  period started a week ago.  Is referred here from [facility] for  evaluation of a fibroid uterus and abnormal uterine bleeding.  She has had a  known fibroid uterus for the past 4-5 years.  Up until a year or two ago,  she had normal cyclic menstrual periods.  For the past six months to a year,  she will start each period approximately three weeks after she starts the  next one, and menstrual flow will last anywhere from 7-10 days.  Because of  the heaviness of the flow, she uses a Depends, both during the day and at  night.  Because of the use of Depends, she has no bed sheet staining.  She  has severe uterine cramping for the first four days.  She was seen in [facility] in April, at which time her hemoglobin was 11.4, hematocrit 37.1.  She  does take iron.   A CT scan performed April 13th showed a uterus with multiple fibroids, some  of which were calcified and some of which were pedunculated.  They estimated  the uterus to be 10.8 x 13 in size.  They estimated there was some central  necrosis present.  The ovaries were normal.   PAST MEDICAL HISTORY:  1.  Operations:  Tubal sterilization in 1985.  Hemorrhoidectomy.  2.  Allergies:  Denied.  3.  Medications:  Iron 3 times a day.  Lisinopril/HCTZ 50 mg a day.   REVIEW OF SYSTEMS:  She is in her normal good health.  She has no separate  cardiovascular, respiratory, GI or GU complaints.   PHYSICAL EXAMINATION:  VITAL SIGNS:  Blood pressure 157/103.  Weight 157.  ABDOMEN:  Soft.  The uterine fundus is palpable 1-2 finger breadths  below  the umbilicus.  It is firm.  There is tenderness on the left side of the  uterus.  PELVIC:  External genitalia and BUS glands were normal.  Vaginal vault was  epithelialized, as was the cervix.  She is on the end of her menstrual  period.  She states that she had a normal Pap smear at [facility] last  September.  The uterus is enlarged and reaches up close to the umbilicus and  extends more laterally on the left side than the right.  The left side of  the fundus is moderately tender.  The ovaries are now palpable.   IMPRESSION:  1.  Leiomyomata uteri.  2.  Symptomatic menorrhagia.   DISPOSITION:  1.  Patient will have an ultrasound today to check out the fibroids as well      as the endometrial thickening.  2.  She  will return in two weeks to see either Dr. Okey Dupre or Dr. Mia Creek to      then schedule an abdominal hysterectomy.  Dorthula Perfect, MD           ______________________________  Dorthula Perfect, MD     ER/MEDQ  D:  06/04/2006  T:  06/04/2006  Job:  [id number]

## 2011-03-28 NOTE — Group Therapy Note (Signed)
Brenda Reyes, Brenda Reyes               ACCOUNT NO.:  [id number]   MEDICAL RECORD NO.:  [id number]          PATIENT TYPE:  WOC   LOCATION:  [facility]                   [facility]ACILITY:  [facility]   PHYSICIAN:  Argentina Donovan, MD        DATE OF BIRTH:  Aug 29, 1956   DATE OF SERVICE:  06/18/2006                                    CLINIC NOTE   The patient is a 55 year old black female gravida 4, para 3-0-1-3 who works  cleaning homes and came in to be scheduled for a total abdominal  hysterectomy.  She has a uterus that measured about 15 weeks' size.  Please  refer to Dr. Tennis Must note for his last examination which was fairly  complete.  A big change in that is that she has had a change in her blood  pressure medication and now the blood pressure is well controlled.  She also  takes iron.  She does not smoke any longer.  She has occasional binge  drinking, but does not drink on a regular basis.  She takes no illegal drugs  and is in good health.  She wishes to have her surgery in November and will  go ahead and get her scheduled and have her come in for a preop appointment  in the office shortly before.   IMPRESSION:  Symptomatic fibroids.           ______________________________  Argentina Donovan, MD     PR/MEDQ  D:  06/18/2006  T:  06/18/2006  Job:  [id number]

## 2011-03-28 NOTE — Group Therapy Note (Signed)
NAME:  CATRIONA, DILLENBECK NO.:  [id number]   MEDICAL RECORD NO.:  [id number]          PATIENT TYPE:  WOC   LOCATION:  [facility]                   [facility]ACILITY:  [facility]   PHYSICIAN:  Argentina Donovan, MD        DATE OF BIRTH:  May 24, 1956   DATE OF SERVICE:  12/26/2004                                    CLINIC NOTE   This patient is a 55 year old black female who was sent in from [facility]  because of fibroids and history of dysfunctional bleeding.  Patient  apparently was drinking heavily and since has stopped over the past few  months has had normal periods.  Has been on iron and her hemoglobin is  elevated from 8 to 10 and she has no more intramenstrual or abnormal  bleeding.  However, she is having a hemorrhoidectomy in the morning and is  extremely nervous about that.  Her other complaint is severe epigastric pain  at night and in between meals which has never been evaluated.  I am going to  check an H. pylori.  If that is negative will put her on an acid blocker of  some type and reevaluate her as far as her bleeding in one month after she  has had her surgery tomorrow.  Patient is on iron, hydrochlorothiazide.  Takes no other medications.  Smokes very rarely.  Has stopped alcohol  completely.   IMPRESSION:  1.  Abdominal/epigastric pain.  2.  Symptomatic hemorrhoids.  3.  Small, asymptomatic leiomyomata uteri.      PR/MEDQ  D:  12/26/2004  T:  12/27/2004  Job:  [id number]

## 2011-03-28 NOTE — H&P (Signed)
Brenda Reyes, Brenda Reyes               ACCOUNT NO.:  [id number]   MEDICAL RECORD NO.:  [id number]           PATIENT TYPE:   LOCATION:                                [facility]   PHYSICIAN:  Phil D. Okey Dupre, M.D.     DATE OF BIRTH:  11-23-1955   DATE OF ADMISSION:  DATE OF DISCHARGE:                                HISTORY & PHYSICAL   CHIEF COMPLAINT:  Heavy vaginal bleeding and low abdominal pressure.   PRESENT ILLNESS:  The patient is a 55 year old African-American female,  gravida 4, para 3, 0-1-3, who was originally referred to use from [facility] because of her fibroids and abnormal uterine bleeding.  She is known  to have a fibroid uterus for 4-5 years but up until about a year ago had  normal periods and then got heavier and heavier, especially the last six  months, prior to her being seen in July.  She has a period that lasts  anywhere from 7-10 days.  It is interfering with her work.  Because of her  heaviness of flow, she has to use Depends, both during the day and at night.  It is severe uterine cramping the first four days and has a difficult time  maintaining her job when this happens.  Discussed the options with this  patient, and she has opted to undergo a total abdominal hysterectomy and is  scheduled for a total abdominal hysterectomy and bilateral salpingo-  oophorectomy on November 5 at [facility].  CT scan performed during  this summer showed a uterus with multiple fibroids, the largest being about  15 cm in diameter.   PAST SURGICAL HISTORY:  She has had surgery of previous tubal ligation in  1985 and a hemorrhoidectomy.   MEDICAL HISTORY:  She has hypertension and is on lisinopril and  hydrochlorothiazide, which is well controlling her pressure.   MEDICATIONS:  She takes lisinopril and hydrochlorothiazide plus iron t.i.d.   ALLERGIES:  She has no known medical allergies.   REVIEW OF SYSTEMS:  She is normally in good health.  She gives no  cardiovascular, respiratory, GI or GU complaints.   PHYSICAL EXAMINATION:  VITAL SIGNS:  Blood pressure 112/72, pulse 91,  temperature 98.2.  Her weight was 160 pounds, and she is 5 feet 7 inches  tall.  GENERAL:  A well-developed and well-nourished black female in no acute  distress.  HEENT:  PERRLA.  Within normal limits.  NECK:  Supple with the thyroid being symmetrical with no dominant masses.  BACK:  Erect and straight.  LUNGS:  Clear to auscultation and percussion.  HEART:  No murmur.  Normal sinus rhythm.  PMI at the 5th IS and MCL.  BREASTS:  Symmetrical.  No dominant masses.  No nipple discharge.  ABDOMEN:  Soft, flat.  The uterus can be felt to deep palpation just above  the symphysis pubis.  There are no surgical scars.  No tenderness.  No  rebound.  No guarding.  EXTREMITIES:  No edema.  No varices.  NEUROLOGIC:  DTRs within normal limits.  SKIN:  Normal turgor.  GENITALIA:  External is normal.  Introitus is marital.  BUS within normal  limits.  The vagina is clean and well rugated.  The cervix is clean and  parous.  The uterus is markedly irregular, easily palpable, and measures  about 15-16 cm in diameter, i.e., about the size of a [redacted] week gestation.  Adnexa could not be palpated because of the large pelvic mass caused by the  uterus.   IMPRESSION:  Symptomatic fibroids with dysmenorrhea and menorrhagia.  I had  a long discussion with the patient in regards to possible complications,  especially those related to anesthetic complications and those due to  infection and hemorrhage.  I also talked about the possibility of urinary  tract and bowel injury during the procedure.           ______________________________  Javier Glazier. Okey Dupre, M.D.     PDR/MEDQ  D:  09/11/2006  T:  09/11/2006  Job:  [id number]

## 2011-03-28 NOTE — Group Therapy Note (Signed)
NAME:  Brenda Reyes, Brenda Reyes NO.:  [id number]   MEDICAL RECORD NO.:  [id number]          PATIENT TYPE:  WOC   LOCATION:  [facility]                   [facility]   PHYSICIAN:  Argentina Donovan, MD        DATE OF BIRTH:  [birth year]   DATE OF SERVICE:  01/28/2005                                    CLINIC NOTE   The patient is a 55 year old black female with a history of dysfunctional  uterine bleeding and fibroids.  The patient recently underwent  hemorrhoidectomy and is feeling 80% better, she states.  She has not had any  bleeding in between periods since her surgery.  Her menstrual bleeding  continues to be heavy.  She has increased menstrual cramping that is  relieved by acetaminophen or ibuprofen.  Her complaints of epigastric pain  have decreased.  She has only had one to two episodes of epigastric pain  since her surgery.  She has completely stopped drinking alcohol.  The  patient continues to smoke occasionally.  We discussed that she is not a  candidate for [employer] due to the fibroid tumor pushing the uterus  anteriorly.  At this point her menstrual bleeding is heavy, but tolerable.  If the patient begins to have intramenstrual bleeding or greatly increased  epigastric pain, she was told to call or return to clinic.      SM/MEDQ  D:  01/28/2005  T:  01/28/2005  Job:  [id number]

## 2011-08-05 LAB — POCT URINALYSIS DIP (DEVICE)
Glucose, UA: NEGATIVE
Ketones, ur: NEGATIVE
Operator id: 297281
Protein, ur: NEGATIVE
Urobilinogen, UA: 0.2

## 2011-08-14 LAB — DIFFERENTIAL
Basophils Absolute: 0.1 10*3/uL (ref 0.0–0.1)
Eosinophils Absolute: 0.1 10*3/uL (ref 0.0–0.7)
Lymphs Abs: 2.1 10*3/uL (ref 0.7–4.0)
Monocytes Absolute: 0.7 10*3/uL (ref 0.1–1.0)
Monocytes Relative: 12 % (ref 3–12)
Neutro Abs: 2.6 10*3/uL (ref 1.7–7.7)

## 2011-08-14 LAB — URINALYSIS, ROUTINE W REFLEX MICROSCOPIC
Glucose, UA: NEGATIVE mg/dL
Hgb urine dipstick: NEGATIVE
Ketones, ur: NEGATIVE mg/dL
pH: 7 (ref 5.0–8.0)

## 2011-08-14 LAB — URINE CULTURE
Colony Count: NO GROWTH
Culture: NO GROWTH

## 2011-08-14 LAB — COMPREHENSIVE METABOLIC PANEL
ALT: 20 U/L (ref 0–35)
AST: 27 U/L (ref 0–37)
Albumin: 4.6 g/dL (ref 3.5–5.2)
Alkaline Phosphatase: 68 U/L (ref 39–117)
Chloride: 98 mEq/L (ref 96–112)
GFR calc Af Amer: >60 mL/min (ref >60)
Potassium: 3.4 mEq/L — ABNORMAL LOW (ref 3.5–5.1)
Total Bilirubin: 1.3 mg/dL — ABNORMAL HIGH (ref 0.3–1.2)

## 2011-08-14 LAB — CBC
Platelets: 307 10*3/uL (ref 150–400)
WBC: 5.6 10*3/uL (ref 4.0–10.5)

## 2011-08-18 LAB — URINALYSIS, ROUTINE W REFLEX MICROSCOPIC
Bilirubin Urine: NEGATIVE
Hgb urine dipstick: NEGATIVE
Protein, ur: NEGATIVE
Urobilinogen, UA: 0.2

## 2011-08-18 LAB — CBC
MCHC: 34.9
MCV: 91.7
Platelets: 301

## 2011-08-18 LAB — WET PREP, GENITAL: Yeast Wet Prep HPF POC: NONE SEEN

## 2011-08-27 ENCOUNTER — Inpatient Hospital Stay (INDEPENDENT_AMBULATORY_CARE_PROVIDER_SITE_OTHER)
Admission: RE | Admit: 2011-08-27 | Discharge: 2011-08-27 | Disposition: A | Discharge status: Home / Self Care | Payer: Self-pay | Source: Ambulatory Visit | Attending: Family Medicine | Admitting: Family Medicine

## 2011-08-27 DIAGNOSIS — R109 Unspecified abdominal pain: Secondary | ICD-10-CM

## 2011-08-27 LAB — LIPASE, BLOOD: Lipase: 47 U/L (ref 11–59)

## 2011-08-27 LAB — POCT I-STAT, CHEM 8
Calcium, Ion: 1.22 mmol/L (ref 1.12–1.32)
Glucose, Bld: 97 mg/dL (ref 70–99)
HCT: 42 % (ref 36.0–46.0)
Hemoglobin: 14.3 g/dL (ref 12.0–15.0)
Potassium: 3.7 mEq/L (ref 3.5–5.1)
Sodium: 140 mEq/L (ref 135–145)
TCO2: 30 mmol/L (ref 0–100)

## 2011-08-27 LAB — ALT: ALT: 19 U/L (ref 0–35)

## 2011-10-06 ENCOUNTER — Other Ambulatory Visit (HOSPITAL_COMMUNITY): Payer: Self-pay | Admitting: Family Medicine

## 2011-10-09 ENCOUNTER — Other Ambulatory Visit (HOSPITAL_COMMUNITY): Payer: Self-pay

## 2011-10-15 ENCOUNTER — Ambulatory Visit (HOSPITAL_COMMUNITY)
Admission: RE | Admit: 2011-10-15 | Discharge: 2011-10-15 | Disposition: A | Discharge status: Home / Self Care | Payer: Self-pay | Source: Ambulatory Visit | Attending: Family Medicine | Admitting: Family Medicine

## 2011-10-15 DIAGNOSIS — R748 Abnormal levels of other serum enzymes: Secondary | ICD-10-CM | POA: Insufficient documentation

## 2011-12-18 ENCOUNTER — Other Ambulatory Visit (HOSPITAL_COMMUNITY): Payer: Self-pay | Admitting: Family Medicine

## 2011-12-18 DIAGNOSIS — Z1231 Encounter for screening mammogram for malignant neoplasm of breast: Secondary | ICD-10-CM

## 2012-01-19 ENCOUNTER — Ambulatory Visit (HOSPITAL_COMMUNITY)
Admission: RE | Admit: 2012-01-19 | Discharge: 2012-01-19 | Disposition: A | Discharge status: Home / Self Care | Payer: Self-pay | Source: Ambulatory Visit | Attending: Family Medicine | Admitting: Family Medicine

## 2012-01-19 DIAGNOSIS — Z1231 Encounter for screening mammogram for malignant neoplasm of breast: Secondary | ICD-10-CM | POA: Insufficient documentation

## 2012-02-20 ENCOUNTER — Encounter (HOSPITAL_COMMUNITY): Payer: Self-pay | Admitting: Emergency Medicine

## 2012-02-20 ENCOUNTER — Emergency Department (HOSPITAL_COMMUNITY)
Admission: EM | Admit: 2012-02-20 | Discharge: 2012-02-20 | Disposition: A | Discharge status: Nursing Home (Includes ICF) | Payer: Self-pay | Source: Home / Self Care | Attending: Family Medicine | Admitting: Family Medicine

## 2012-02-20 ENCOUNTER — Encounter (HOSPITAL_COMMUNITY): Payer: Self-pay | Admitting: *Deleted

## 2012-02-20 ENCOUNTER — Emergency Department (HOSPITAL_COMMUNITY): Payer: Self-pay

## 2012-02-20 ENCOUNTER — Emergency Department (HOSPITAL_COMMUNITY)
Admission: EM | Admit: 2012-02-20 | Discharge: 2012-02-20 | Disposition: A | Discharge status: Home / Self Care | Payer: Self-pay | Attending: Emergency Medicine | Admitting: Emergency Medicine

## 2012-02-20 DIAGNOSIS — Z79899 Other long term (current) drug therapy: Secondary | ICD-10-CM | POA: Insufficient documentation

## 2012-02-20 DIAGNOSIS — R0602 Shortness of breath: Secondary | ICD-10-CM | POA: Insufficient documentation

## 2012-02-20 DIAGNOSIS — R079 Chest pain, unspecified: Secondary | ICD-10-CM | POA: Insufficient documentation

## 2012-02-20 DIAGNOSIS — I10 Essential (primary) hypertension: Secondary | ICD-10-CM | POA: Insufficient documentation

## 2012-02-20 HISTORY — DX: Essential (primary) hypertension: I10

## 2012-02-20 LAB — COMPREHENSIVE METABOLIC PANEL WITH GFR
ALT: 17 U/L (ref 0–35)
AST: 24 U/L (ref 0–37)
Albumin: 4.1 g/dL (ref 3.5–5.2)
Alkaline Phosphatase: 69 U/L (ref 39–117)
BUN: 13 mg/dL (ref 6–23)
CO2: 27 meq/L (ref 19–32)
Calcium: 9.5 mg/dL (ref 8.4–10.5)
Chloride: 98 meq/L (ref 96–112)
Creatinine, Ser: 0.58 mg/dL (ref 0.50–1.10)
GFR calc Af Amer: >90 mL/min
GFR calc non Af Amer: >90 mL/min
Glucose, Bld: 124 mg/dL — ABNORMAL HIGH (ref 70–99)
Potassium: 3.1 meq/L — ABNORMAL LOW (ref 3.5–5.1)
Sodium: 136 meq/L (ref 135–145)
Total Bilirubin: 0.4 mg/dL (ref 0.3–1.2)
Total Protein: 6.8 g/dL (ref 6.0–8.3)

## 2012-02-20 LAB — CBC
HCT: 37 % (ref 36.0–46.0)
Hemoglobin: 12.9 g/dL (ref 12.0–15.0)
MCH: 30.5 pg (ref 26.0–34.0)
MCHC: 34.9 g/dL (ref 30.0–36.0)
RBC: 4.23 MIL/uL (ref 3.87–5.11)

## 2012-02-20 LAB — D-DIMER, QUANTITATIVE: D-Dimer, Quant: 0.26 ug{FEU}/mL (ref 0.00–0.48)

## 2012-02-20 LAB — DIFFERENTIAL
Basophils Relative: 0 % (ref 0–1)
Eosinophils Absolute: 0.3 10*3/uL (ref 0.0–0.7)
Lymphs Abs: 2.1 10*3/uL (ref 0.7–4.0)
Monocytes Absolute: 0.5 10*3/uL (ref 0.1–1.0)
Monocytes Relative: 10 % (ref 3–12)
Neutrophils Relative %: 37 % — ABNORMAL LOW (ref 43–77)

## 2012-02-20 LAB — TROPONIN I: Troponin I: <0.3 ng/mL

## 2012-02-20 MED ORDER — LISINOPRIL-HYDROCHLOROTHIAZIDE 20-12.5 MG PO TABS: ORAL_TABLET | ORAL | Status: DC

## 2012-02-20 NOTE — ED Notes (Signed)
Patient states her cousin died last week,  She states she has drank more and smoked since the death.  She states her mother is here as a patient as well.  Patient states she had near syncope event on Monday.  She states since she has had chest pain and tightness in her chest.

## 2012-02-20 NOTE — ED Provider Notes (Signed)
History     CSN: [id number]  Arrival date & time 02/20/12  1032   First MD Initiated Contact with Patient 02/20/12 1359      Chief Complaint  Patient presents with  . Chest Pain    (Consider location/radiation/quality/duration/timing/severity/associated sxs/prior treatment) Patient is a 56 y.o. female presenting with chest pain. The history is provided by the patient (The patient states that she's been having some chest pains recently. Mild shortness of breath. She's been very stressed her cousin died recently and her mother is in intensive care.). No language interpreter was used.  Chest Pain The chest pain began 2 days ago. Chest pain occurs frequently. The chest pain is resolved. The pain is associated with stress. At its most intense, the pain is at 4/10. The pain is currently at 2/10. The quality of the pain is described as aching and dull. The pain does not radiate. Chest pain is worsened by exertion. Pertinent negatives for primary symptoms include no fever, no fatigue, no cough and no abdominal pain.  Pertinent negatives for associated symptoms include no claudication. She tried nothing for the symptoms. Risk factors: htn.  Pertinent negatives for past medical history include no aneurysm and no seizures.     Past Medical History  Diagnosis Date  . Hypertension     Past Surgical History  Procedure Date  . Abdominal hysterectomy   . Thyroid surgery   . Tubal ligation     No family history on file.  History  Substance Use Topics  . Smoking status: Current Some Day Smoker  . Smokeless tobacco: Not on file  . Alcohol Use: Yes    OB History    Grav Para Term Preterm Abortions TAB SAB Ect Mult Living                  Review of Systems  Constitutional: Negative for fever and fatigue.  HENT: Negative for congestion, sinus pressure and ear discharge.   Eyes: Negative for discharge.  Respiratory: Negative for cough.   Cardiovascular: Positive for chest pain.  Negative for claudication.  Gastrointestinal: Negative for abdominal pain and diarrhea.  Genitourinary: Negative for frequency and hematuria.  Musculoskeletal: Negative for back pain.  Skin: Negative for rash.  Neurological: Negative for seizures and headaches.  Hematological: Negative.   Psychiatric/Behavioral: Negative for hallucinations.    Allergies  Hydrochlorothiazide w/triamterene  Home Medications   Current Outpatient Rx  Name Route Sig Dispense Refill  . AMLODIPINE BESYLATE 10 MG PO TABS Oral Take 10 mg by mouth daily.    Marland Kitchen CALCIUM CARBONATE-VITAMIN D 600-400 MG-UNIT PO TABS Oral Take 1 tablet by mouth daily.    Marland Kitchen CLONIDINE HCL 0.2 MG PO TABS Oral Take 0.2 mg by mouth at bedtime.     Marland Kitchen LISINOPRIL-HYDROCHLOROTHIAZIDE 20-12.5 MG PO TABS Oral Take 2 tablets by mouth daily.     Marland Kitchen METOPROLOL SUCCINATE ER 200 MG PO TB24 Oral Take 200 mg by mouth daily.    Marland Kitchen PRENATAL 27-0.8 MG PO TABS Oral Take 1 tablet by mouth daily.    Marland Kitchen VITAMIN B-1 100 MG PO TABS Oral Take 100 mg by mouth daily.    Marland Kitchen LISINOPRIL-HYDROCHLOROTHIAZIDE 20-12.5 MG PO TABS  Take 2 tablets a day 60 tablet 0    BP 138/92  Pulse 61  Temp(Src) 98 F (36.7 C) (Oral)  Resp 13  Ht 5\' 6"  (1.676 m)  Wt 154 lb (69.854 kg)  BMI 24.86 kg/m2  SpO2 100%  Physical Exam  Constitutional:  She is oriented to person, place, and time. She appears well-developed.  HENT:  Head: Normocephalic and atraumatic.  Eyes: Conjunctivae and EOM are normal. No scleral icterus.  Neck: Neck supple. No thyromegaly present.  Cardiovascular: Normal rate and regular rhythm.  Exam reveals no gallop and no friction rub.   No murmur heard. Pulmonary/Chest: No stridor. She has no wheezes. She has no rales. She exhibits no tenderness.  Abdominal: She exhibits no distension. There is no tenderness. There is no rebound.  Musculoskeletal: Normal range of motion. She exhibits no edema.  Lymphadenopathy:    She has no cervical adenopathy.    Neurological: She is oriented to person, place, and time. Coordination normal.  Skin: No rash noted. No erythema.  Psychiatric: She has a normal mood and affect. Her behavior is normal.    ED Course  Procedures (including critical care time)  Labs Reviewed  DIFFERENTIAL - Abnormal; Notable for the following:    Neutrophils Relative 37 (*)    Eosinophils Relative 6 (*)    All other components within normal limits  COMPREHENSIVE METABOLIC PANEL - Abnormal; Notable for the following:    Potassium 3.1 (*)    Glucose, Bld 124 (*)    All other components within normal limits  CBC  D-DIMER, QUANTITATIVE  TROPONIN I   Dg Chest 2 View  02/20/2012  *RADIOLOGY REPORT*  Clinical Data: Left-sided chest pain, shortness of breath  CHEST - 2 VIEW  Comparison: 08/02/2008; 12/20/2004  Findings: Normal cardiac silhouette and mediastinal contours.  No focal parenchymal opacities.  No pleural effusion or pneumothorax. No acute osseous abnormalities.  IMPRESSION: No acute cardiopulmonary disease.  Original Report Authenticated By: Waynard Reeds, M.D.     1. Chest pain     Date: 02/20/2012  Rate: 69  Rhythm: normal sinus rhythm  QRS Axis: normal  Intervals: normal  ST/T Wave abnormalities: normal  Conduction Disutrbances:none  Narrative Interpretation:   Old EKG Reviewed: none available     MDM  Stressed caused chest pain,  Normal tests pt to follow up        Benny Lennert, MD 02/20/12 1712

## 2012-02-20 NOTE — ED Provider Notes (Addendum)
History     CSN: [id number]  Arrival date & time 02/20/12  5621   First MD Initiated Contact with Patient 02/20/12 [phone]      Chief Complaint  Patient presents with  . Chest Pain  . Shortness of Breath    (Consider location/radiation/quality/duration/timing/severity/associated sxs/prior treatment) HPI Comments: Brenda Reyes presents for evaluation of intermittent chest discomfort and shortness of breath over the last week. She reports that symptoms started on Monday as she was cutting her grass. She reports that she bends over to loosen her dog's chain, when she stood back up became lightheaded and had to sit down. Beginning Tuesday, she reports left-sided chest discomfort, described as a tightness. She also reports numbness and tingling in her extremities. She also reports persistent shortness of breath, mostly with activities. 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 She denies any visual changes, headaches, nausea or vomiting. She is a history of hypertension and a remote history of cigarette smoking. She currently takes 5 blood pressure medications. She reports an increase in alcohol intake recently secondary to several family member deaths and other stressors. She does not smoke cigarettes a daily basis states that she used to. EKG at the urgent care Center shows normal sinus rhythm with a rate of 69 beats per minute. There are no ST or T wave changes. Exam is unremarkable.  Patient is a 56 y.o. female presenting with chest pain. The history is provided by the patient.  Chest Pain The chest pain began 3 - 5 days ago. Chest pain occurs constantly. The chest pain is unchanged. The pain is associated with exertion and lifting. The quality of the pain is described as aching and tightness. The pain does not radiate. Primary symptoms include shortness of breath and  dizziness. Pertinent negatives for primary symptoms include no palpitations, no nausea and no vomiting.  Dizziness also occurs with weakness. Dizziness does not occur with nausea or vomiting.  Associated symptoms include numbness and weakness. She tried nothing for the symptoms. Risk factors include smoking/tobacco exposure, stress and alcohol intake.     Past Medical History  Diagnosis Date  . Hypertension     Past Surgical History  Procedure Date  . Abdominal hysterectomy   . Thyroid surgery   . Tubal ligation     No family history on file.  History  Substance Use Topics  . Smoking status: Not on file  . Smokeless tobacco: Not on file  . Alcohol Use: Yes    OB History    Grav Para Term Preterm Abortions TAB SAB Ect Mult Living                  Review of Systems  HENT: Negative.   Eyes: Negative.   Respiratory: Positive for shortness of breath.   Cardiovascular: Positive for chest pain. Negative for palpitations.  Gastrointestinal: Negative.  Negative for nausea and vomiting.  Genitourinary: Negative.   Musculoskeletal: Negative.   Skin: Negative.   Neurological: Positive for dizziness, weakness and numbness.    Allergies  Hydrochlorothiazide w/triamterene  Home Medications   Current Outpatient Rx  Name Route Sig Dispense Refill  . AMLODIPINE BESY-BENAZEPRIL HCL 10-20 MG PO CAPS Oral Take 1 capsule by mouth daily.    Marland Kitchen CLONIDINE HCL 0.2 MG PO TABS Oral Take 0.2 mg by mouth 2 (two) times daily.    Marland Kitchen LISINOPRIL 40  MG PO TABS Oral Take 40 mg by mouth daily.    Marland Kitchen METOPROLOL SUCCINATE ER 200 MG PO TB24 Oral Take 200 mg by mouth daily.      BP 146/91  Pulse 74  Temp(Src) 98.8 F (37.1 C) (Oral)  Resp 20  SpO2 98%  Physical Exam  Nursing note and vitals reviewed. Constitutional: She is oriented to person, place, and time. She appears well-developed and well-nourished.  HENT:  Head: Normocephalic and atraumatic.  Eyes: EOM are normal. Pupils are equal,  round, and reactive to light.  Neck: Normal range of motion.  Cardiovascular: Normal rate, regular rhythm, S1 normal, S2 normal and normal heart sounds.   No murmur heard.      ECG: normal sinus rhythm, rate 69 bpm; no ST or T wave changes  Pulmonary/Chest: Effort normal and breath sounds normal. She has no decreased breath sounds. She has no wheezes. She has no rhonchi.  Musculoskeletal: Normal range of motion.  Neurological: She is alert and oriented to person, place, and time.  Skin: Skin is warm and dry.  Psychiatric: Her behavior is normal.    ED Course  Procedures (including critical care time)  Labs Reviewed - No data to display No results found.   1. Chest pain       MDM  Transferred to the Emergency Department for further evaluation of chest pain        Renaee Munda, MD 02/20/12 1030  Renaee Munda, MD 02/20/12 1030

## 2012-02-20 NOTE — Discharge Instructions (Signed)
Transferred to Emergency Department for evaluation of chest pain.

## 2012-02-20 NOTE — ED Notes (Signed)
PT HERE WITH INTERMITT LEFT CHEST WALL PAIN RADIATING TO R HAND/FEET NUMB/TINGLING SENSATION THAT STARTED ON Monday WHILE CUTTING GRASS.PT STATES WHEN SHE BENT DOWN SHE FELT CHEST TIGHTNESS AND SOB,FATIGUE WHICH EASED UP BUT HAS BEEN ONGOING WITH EPISODE THIS AM SHOCKING FEELING ON LEFT SIDE OF BODY BUT SUBSIDED.NO SWELLING OR DISTRESS AT THIS TIME.PT DENIES PAIN AT ALL.EKG DONE

## 2012-02-20 NOTE — Discharge Instructions (Signed)
Follow up with your md next week for recheck °

## 2012-02-20 NOTE — ED Notes (Signed)
Patient states that she has had stress and increase in fatigue. Pt states that she has had intermittent hand and feet numbness with same.  Pt states that she went to [facility] and was sent here for further evaluation.  Pt denies any complaints. Pt states that she feels great right now.  When RN asked her what we could do for her here today,  She said that the MD at [facility] wanted her checked further because of family history.

## 2012-05-04 ENCOUNTER — Encounter (HOSPITAL_COMMUNITY): Payer: Self-pay | Admitting: *Deleted

## 2012-05-04 ENCOUNTER — Emergency Department (HOSPITAL_COMMUNITY)
Admission: EM | Admit: 2012-05-04 | Discharge: 2012-05-04 | Disposition: A | Discharge status: Home / Self Care | Payer: Self-pay | Attending: Emergency Medicine | Admitting: Emergency Medicine

## 2012-05-04 DIAGNOSIS — I10 Essential (primary) hypertension: Secondary | ICD-10-CM | POA: Insufficient documentation

## 2012-05-04 DIAGNOSIS — M25569 Pain in unspecified knee: Secondary | ICD-10-CM | POA: Insufficient documentation

## 2012-05-04 DIAGNOSIS — J069 Acute upper respiratory infection, unspecified: Secondary | ICD-10-CM | POA: Insufficient documentation

## 2012-05-04 DIAGNOSIS — M549 Dorsalgia, unspecified: Secondary | ICD-10-CM | POA: Insufficient documentation

## 2012-05-04 DIAGNOSIS — R51 Headache: Secondary | ICD-10-CM | POA: Insufficient documentation

## 2012-05-04 MED ORDER — DEXAMETHASONE SODIUM PHOSPHATE 4 MG/ML IJ SOLN
10.0000 mg | Freq: Once | INTRAMUSCULAR | Status: AC
  Administered 2012-05-04: 10 mg via INTRAVENOUS
  Filled 2012-05-04: qty 1
  Filled 2012-05-04: qty 2

## 2012-05-04 MED ORDER — METOCLOPRAMIDE HCL 5 MG/ML IJ SOLN
10.0000 mg | Freq: Once | INTRAMUSCULAR | Status: AC
  Administered 2012-05-04: 10 mg via INTRAVENOUS
  Filled 2012-05-04: qty 2

## 2012-05-04 MED ORDER — SODIUM CHLORIDE 0.9 % IV BOLUS (SEPSIS)
1000.0000 mL | Freq: Once | INTRAVENOUS | Status: AC
  Administered 2012-05-04: 1000 mL via INTRAVENOUS

## 2012-05-04 MED ORDER — ACETAMINOPHEN 325 MG PO TABS
650.0000 mg | ORAL_TABLET | Freq: Once | ORAL | Status: AC
  Administered 2012-05-04: 650 mg via ORAL
  Filled 2012-05-04: qty 2

## 2012-05-04 MED ORDER — DIPHENHYDRAMINE HCL 50 MG/ML IJ SOLN
25.0000 mg | Freq: Once | INTRAMUSCULAR | Status: AC
  Administered 2012-05-04: 25 mg via INTRAVENOUS
  Filled 2012-05-04: qty 1

## 2012-05-04 NOTE — ED Notes (Signed)
Patient reports onset of burning pain in her eyes, headache, fever, back pain last night.  She also reports knee pain since last week.  Patient states she continues to have headache, esp behind her eyes,  States her eyes feel like they are on fire.  Patient took tylenol last night at 1800.  Patient denies diff voiding.  She denies changes in her vision.  She denies neck pain

## 2012-05-04 NOTE — Discharge Instructions (Signed)
Headaches, Frequently Asked Questions MIGRAINE HEADACHES Q: What is migraine? What causes it? How can I treat it? A: Generally, migraine headaches begin as a dull ache. Then they develop into a constant, throbbing, and pulsating pain. You may experience pain at the temples. You may experience pain at the front or back of one or both sides of the head. The pain is usually accompanied by a combination of:  Nausea.   Vomiting.   Sensitivity to light and noise.  Some people (about 15%) experience an aura (see below) before an attack. The cause of migraine is believed to be chemical reactions in the brain. Treatment for migraine may include over-the-counter or prescription medications. It may also include self-help techniques. These include relaxation training and biofeedback.  Q: What is an aura? A: About 15% of people with migraine get an "aura". This is a sign of neurological symptoms that occur before a migraine headache. You may see wavy or jagged lines, dots, or flashing lights. You might experience tunnel vision or blind spots in one or both eyes. The aura can include visual or auditory hallucinations (something imagined). It may include disruptions in smell (such as strange odors), taste or touch. Other symptoms include:  Numbness.   A "pins and needles" sensation.   Difficulty in recalling or speaking the correct word.  These neurological events may last as long as 60 minutes. These symptoms will fade as the headache begins. Q: What is a trigger? A: Certain physical or environmental factors can lead to or "trigger" a migraine. These include:  Foods.   Hormonal changes.   Weather.   Stress.  It is important to remember that triggers are different for everyone. To help prevent migraine attacks, you need to figure out which triggers affect you. Keep a headache diary. This is a good way to track triggers. The diary will help you talk to your healthcare professional about your  condition. Q: Does weather affect migraines? A: Bright sunshine, hot, humid conditions, and drastic changes in barometric pressure may lead to, or "trigger," a migraine attack in some people. But studies have shown that weather does not act as a trigger for everyone with migraines. Q: What is the link between migraine and hormones? A: Hormones start and regulate many of your body's functions. Hormones keep your body in balance within a constantly changing environment. The levels of hormones in your body are unbalanced at times. Examples are during menstruation, pregnancy, or menopause. That can lead to a migraine attack. In fact, about three quarters of all women with migraine report that their attacks are related to the menstrual cycle.  Q: Is there an increased risk of stroke for migraine sufferers? A: The likelihood of a migraine attack causing a stroke is very remote. That is not to say that migraine sufferers cannot have a stroke associated with their migraines. In persons under age 40, the most common associated factor for stroke is migraine headache. But over the course of a person's normal life span, the occurrence of migraine headache may actually be associated with a reduced risk of dying from cerebrovascular disease due to stroke.  Q: What are acute medications for migraine? A: Acute medications are used to treat the pain of the headache after it has started. Examples over-the-counter medications, NSAIDs, ergots, and triptans.  Q: What are the triptans? A: Triptans are the newest class of abortive medications. They are specifically targeted to treat migraine. Triptans are vasoconstrictors. They moderate some chemical reactions in the brain.   The triptans work on receptors in your brain. Triptans help to restore the balance of a neurotransmitter called serotonin. Fluctuations in levels of serotonin are thought to be a main cause of migraine.  Q: Are over-the-counter medications for migraine  effective? A: Over-the-counter, or "OTC," medications may be effective in relieving mild to moderate pain and associated symptoms of migraine. But you should see your caregiver before beginning any treatment regimen for migraine.  Q: What are preventive medications for migraine? A: Preventive medications for migraine are sometimes referred to as "prophylactic" treatments. They are used to reduce the frequency, severity, and length of migraine attacks. Examples of preventive medications include antiepileptic medications, antidepressants, beta-blockers, calcium channel blockers, and NSAIDs (nonsteroidal anti-inflammatory drugs). Q: Why are anticonvulsants used to treat migraine? A: During the past few years, there has been an increased interest in antiepileptic drugs for the prevention of migraine. They are sometimes referred to as "anticonvulsants". Both epilepsy and migraine may be caused by similar reactions in the brain.  Q: Why are antidepressants used to treat migraine? A: Antidepressants are typically used to treat people with depression. They may reduce migraine frequency by regulating chemical levels, such as serotonin, in the brain.  Q: What alternative therapies are used to treat migraine? A: The term "alternative therapies" is often used to describe treatments considered outside the scope of conventional Western medicine. Examples of alternative therapy include acupuncture, acupressure, and yoga. Another common alternative treatment is herbal therapy. Some herbs are believed to relieve headache pain. Always discuss alternative therapies with your caregiver before proceeding. Some herbal products contain arsenic and other toxins. TENSION HEADACHES Q: What is a tension-type headache? What causes it? How can I treat it? A: Tension-type headaches occur randomly. They are often the result of temporary stress, anxiety, fatigue, or anger. Symptoms include soreness in your temples, a tightening  band-like sensation around your head (a "vice-like" ache). Symptoms can also include a pulling feeling, pressure sensations, and contracting head and neck muscles. The headache begins in your forehead, temples, or the back of your head and neck. Treatment for tension-type headache may include over-the-counter or prescription medications. Treatment may also include self-help techniques such as relaxation training and biofeedback. CLUSTER HEADACHES Q: What is a cluster headache? What causes it? How can I treat it? A: Cluster headache gets its name because the attacks come in groups. The pain arrives with little, if any, warning. It is usually on one side of the head. A tearing or bloodshot eye and a runny nose on the same side of the headache may also accompany the pain. Cluster headaches are believed to be caused by chemical reactions in the brain. They have been described as the most severe and intense of any headache type. Treatment for cluster headache includes prescription medication and oxygen. SINUS HEADACHES Q: What is a sinus headache? What causes it? How can I treat it? A: When a cavity in the bones of the face and skull (a sinus) becomes inflamed, the inflammation will cause localized pain. This condition is usually the result of an allergic reaction, a tumor, or an infection. If your headache is caused by a sinus blockage, such as an infection, you will probably have a fever. An x-ray will confirm a sinus blockage. Your caregiver's treatment might include antibiotics for the infection, as well as antihistamines or decongestants.  REBOUND HEADACHES Q: What is a rebound headache? What causes it? How can I treat it? A: A pattern of taking acute headache medications too   often can lead to a condition known as "rebound headache." A pattern of taking too much headache medication includes taking it more than 2 days per week or in excessive amounts. That means more than the label or a caregiver advises.  With rebound headaches, your medications not only stop relieving pain, they actually begin to cause headaches. Doctors treat rebound headache by tapering the medication that is being overused. Sometimes your caregiver will gradually substitute a different type of treatment or medication. Stopping may be a challenge. Regularly overusing a medication increases the potential for serious side effects. Consult a caregiver if you regularly use headache medications more than 2 days per week or more than the label advises. ADDITIONAL QUESTIONS AND ANSWERS Q: What is biofeedback? A: Biofeedback is a self-help treatment. Biofeedback uses special equipment to monitor your body's involuntary physical responses. Biofeedback monitors:  Breathing.   Pulse.   Heart rate.   Temperature.   Muscle tension.   Brain activity.  Biofeedback helps you refine and perfect your relaxation exercises. You learn to control the physical responses that are related to stress. Once the technique has been mastered, you do not need the equipment any more. Q: Are headaches hereditary? A: Four out of five (80%) of people that suffer report a family history of migraine. Scientists are not sure if this is genetic or a family predisposition. Despite the uncertainty, a child has a 50% chance of having migraine if one parent suffers. The child has a 75% chance if both parents suffer.  Q: Can children get headaches? A: By the time they reach high school, most young people have experienced some type of headache. Many safe and effective approaches or medications can prevent a headache from occurring or stop it after it has begun.  Q: What type of doctor should I see to diagnose and treat my headache? A: Start with your primary caregiver. Discuss his or her experience and approach to headaches. Discuss methods of classification, diagnosis, and treatment. Your caregiver may decide to recommend you to a headache specialist, depending upon  your symptoms or other physical conditions. Having diabetes, allergies, etc., may require a more comprehensive and inclusive approach to your headache. The National Headache Foundation will provide, upon request, a list of [facility] physician members in your state. Document Released: 01/17/2004 Document Revised: 10/16/2011 Document Reviewed: 06/26/2008 [facility] Patient Information 2012 [place], [place].Upper Respiratory Infection, Adult An upper respiratory infection (URI) is also sometimes known as the common cold. The upper respiratory tract includes the nose, sinuses, throat, trachea, and bronchi. Bronchi are the airways leading to the lungs. Most people improve within 1 week, but symptoms can last up to 2 weeks. A residual cough may last even longer.  CAUSES Many different viruses can infect the tissues lining the upper respiratory tract. The tissues become irritated and inflamed and often become very moist. Mucus production is also common. A cold is contagious. You can easily spread the virus to others by oral contact. This includes kissing, sharing a glass, coughing, or sneezing. Touching your mouth or nose and then touching a surface, which is then touched by another person, can also spread the virus. SYMPTOMS  Symptoms typically develop 1 to 3 days after you come in contact with a cold virus. Symptoms vary from person to person. They may include:  Runny nose.   Sneezing.   Nasal congestion.   Sinus irritation.   Sore throat.   Loss of voice (laryngitis).   Cough.   Fatigue.  Muscle aches.   Loss of appetite.   Headache.   Low-grade fever.  DIAGNOSIS  You might diagnose your own cold based on familiar symptoms, since most people get a cold 2 to 3 times a year. Your caregiver can confirm this based on your exam. Most importantly, your caregiver can check that your symptoms are not due to another disease such as strep throat, sinusitis, pneumonia, asthma, or epiglottitis. Blood tests,  throat tests, and X-rays are not necessary to diagnose a common cold, but they may sometimes be helpful in excluding other more serious diseases. Your caregiver will decide if any further tests are required. RISKS AND COMPLICATIONS  You may be at risk for a more severe case of the common cold if you smoke cigarettes, have chronic heart disease (such as heart failure) or lung disease (such as asthma), or if you have a weakened immune system. The very young and very old are also at risk for more serious infections. Bacterial sinusitis, middle ear infections, and bacterial pneumonia can complicate the common cold. The common cold can worsen asthma and chronic obstructive pulmonary disease (COPD). Sometimes, these complications can require emergency medical care and may be life-threatening. PREVENTION  The best way to protect against getting a cold is to practice good hygiene. Avoid oral or hand contact with people with cold symptoms. Wash your hands often if contact occurs. There is no clear evidence that vitamin C, vitamin E, echinacea, or exercise reduces the chance of developing a cold. However, it is always recommended to get plenty of rest and practice good nutrition. TREATMENT  Treatment is directed at relieving symptoms. There is no cure. Antibiotics are not effective, because the infection is caused by a virus, not by bacteria. Treatment may include:  Increased fluid intake. Sports drinks offer valuable electrolytes, sugars, and fluids.   Breathing heated mist or steam (vaporizer or shower).   Eating chicken soup or other clear broths, and maintaining good nutrition.   Getting plenty of rest.   Using gargles or lozenges for comfort.   Controlling fevers with ibuprofen or acetaminophen as directed by your caregiver.   Increasing usage of your inhaler if you have asthma.  Zinc gel and zinc lozenges, taken in the first 24 hours of the common cold, can shorten the duration and lessen the  severity of symptoms. Pain medicines may help with fever, muscle aches, and throat pain. A variety of non-prescription medicines are available to treat congestion and runny nose. Your caregiver can make recommendations and may suggest nasal or lung inhalers for other symptoms.  HOME CARE INSTRUCTIONS   Only take over-the-counter or prescription medicines for pain, discomfort, or fever as directed by your caregiver.   Use a warm mist humidifier or inhale steam from a shower to increase air moisture. This may keep secretions moist and make it easier to breathe.   Drink enough water and fluids to keep your urine clear or pale yellow.   Rest as needed.   Return to work when your temperature has returned to normal or as your caregiver advises. You may need to stay home longer to avoid infecting others. You can also use a face mask and careful hand washing to prevent spread of the virus.  SEEK MEDICAL CARE IF:   After the first few days, you feel you are getting worse rather than better.   You need your caregiver's advice about medicines to control symptoms.   You develop chills, worsening shortness of breath, or brown or  red sputum. These may be signs of pneumonia.   You develop yellow or brown nasal discharge or pain in the face, especially when you bend forward. These may be signs of sinusitis.   You develop a fever, swollen neck glands, pain with swallowing, or white areas in the back of your throat. These may be signs of strep throat.  SEEK IMMEDIATE MEDICAL CARE IF:   You have a fever.   You develop severe or persistent headache, ear pain, sinus pain, or chest pain.   You develop wheezing, a prolonged cough, cough up blood, or have a change in your usual mucus (if you have chronic lung disease).   You develop sore muscles or a stiff neck.  Document Released: 04/22/2001 Document Revised: 10/16/2011 Document Reviewed: 02/28/2011 [facility] Patient Information 2012 [place], [place].Viral  Infections A virus is a type of germ. Viruses can cause:  Minor sore throats.   Aches and pains.   Headaches.   Runny nose.   Rashes.   Watery eyes.   Tiredness.   Coughs.   Loss of appetite.   Feeling sick to your stomach (nausea).   Throwing up (vomiting).   Watery poop (diarrhea).  HOME CARE   Only take medicines as told by your doctor.   Drink enough water and fluids to keep your pee (urine) clear or pale yellow. Sports drinks are a good choice.   Get plenty of rest and eat healthy. Soups and broths with crackers or rice are fine.  GET HELP RIGHT AWAY IF:   You have a very bad headache.   You have shortness of breath.   You have chest pain or neck pain.   You have an unusual rash.   You cannot stop throwing up.   You have watery poop that does not stop.   You cannot keep fluids down.   You or your child has a temperature by mouth above 102 F (38.9 C), not controlled by medicine.   Your baby is older than 3 months with a rectal temperature of 102 F (38.9 C) or higher.   Your baby is 57 months old or younger with a rectal temperature of 100.4 F (38 C) or higher.  MAKE SURE YOU:   Understand these instructions.   Will watch this condition.   Will get help right away if you are not doing well or get worse.  Document Released: 10/09/2008 Document Revised: 10/16/2011 Document Reviewed: 03/04/2011 [facility] Patient Information 2012 [place], [place].

## 2012-05-04 NOTE — ED Provider Notes (Signed)
History     CSN: [id number]  Arrival date & time 05/04/12  0741   First MD Initiated Contact with Patient 05/04/12 [phone]      Chief Complaint  Patient presents with  . Fever  . Headache  . Back Pain  . Knee Pain    (Consider location/radiation/quality/duration/timing/severity/associated sxs/prior treatment) Patient is a 56 y.o. female presenting with fever, headaches, back pain, and knee pain.  Fever Primary symptoms of the febrile illness include fever and headaches (frontal). Primary symptoms do not include cough, wheezing, shortness of breath, abdominal pain, nausea, vomiting, diarrhea, dysuria or rash. The current episode started yesterday. This is a new problem. The problem has not changed since onset. The headache began yesterday. The headache developed gradually. Headache is a new problem. The headache is not associated with neck stiffness or weakness.  Risk factors: None. Headache  Associated symptoms include a fever. Pertinent negatives include no palpitations, no shortness of breath, no nausea and no vomiting.  Back Pain  Associated symptoms include a fever and headaches (frontal). Pertinent negatives include no chest pain, no numbness, no abdominal pain, no dysuria and no weakness.  Knee Pain This is a new problem. The current episode started 1 to 4 weeks ago. The problem occurs constantly. The problem has been gradually improving. Associated symptoms include chills, congestion, a fever and headaches (frontal). Pertinent negatives include no abdominal pain, chest pain, coughing, nausea, neck pain, numbness, rash, vomiting or weakness. 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 She has tried nothing for the symptoms.    Past Medical History  Diagnosis Date  . Hypertension     Past Surgical History  Procedure Date  . Abdominal hysterectomy   . Tubal ligation   . Hemorrhoid surgery     No family  history on file.  History  Substance Use Topics  . Smoking status: Current Some Day Smoker  . Smokeless tobacco: Not on file  . Alcohol Use: Yes     admits to drinking a lot,  states she has not drank in 2 weeks but prior she was heavy drinker    OB History    Grav Para Term Preterm Abortions TAB SAB Ect Mult Living                  Review of Systems  Constitutional: Positive for fever and chills. Negative for activity change and appetite change.  HENT: Positive for congestion and rhinorrhea. Negative for neck pain and neck stiffness.   Respiratory: Negative for cough, chest tightness, shortness of breath and wheezing.   Cardiovascular: Negative for chest pain and palpitations.  Gastrointestinal: Negative for nausea, vomiting, abdominal pain, diarrhea and constipation.  Genitourinary: Negative for dysuria, flank pain, decreased urine volume and difficulty urinating.  Musculoskeletal: Positive for back pain.  Skin: Negative for rash and wound.  Neurological: Positive for headaches (frontal). Negative for seizures, syncope, facial asymmetry, weakness, light-headedness and numbness.  Psychiatric/Behavioral: Negative for confusion and agitation.  All other systems reviewed and are negative.    Allergies  Hydrochlorothiazide w-triamterene  Home Medications   Current Outpatient Rx  Name Route Sig Dispense Refill  . ACETAMINOPHEN 500 MG PO TABS Oral Take 1,000 mg by mouth every 6 (six) hours as needed. For breathing    . AMLODIPINE BESYLATE 10 MG PO TABS Oral Take 10 mg by mouth daily.    Marland Kitchen CLONIDINE HCL 0.2 MG PO TABS Oral Take 0.2 mg by  mouth at bedtime.     Marland Kitchen LISINOPRIL-HYDROCHLOROTHIAZIDE 20-12.5 MG PO TABS Oral Take 2 tablets by mouth daily.     Marland Kitchen LORATADINE 10 MG PO TABS Oral Take 10 mg by mouth daily.    Marland Kitchen METOPROLOL SUCCINATE ER 200 MG PO TB24 Oral Take 200 mg by mouth daily.      BP 153/92  Pulse 100  Temp 101 F (38.3 C) (Oral)  Resp 20  Ht 5' 6.5" (1.689 m)  Wt  153 lb (69.4 kg)  BMI 24.32 kg/m2  SpO2 99%  Physical Exam  Nursing note and vitals reviewed. Constitutional: She is oriented to person, place, and time. She appears well-developed and well-nourished.  HENT:  Head: Normocephalic and atraumatic.  Right Ear: External ear normal.  Left Ear: External ear normal.  Nose: Nose normal.  Mouth/Throat: Oropharynx is clear and moist. No oropharyngeal exudate.  Eyes: Conjunctivae are normal. Pupils are equal, round, and reactive to light.  Neck: Normal range of motion. Neck supple.  Cardiovascular: Normal rate, regular rhythm, normal heart sounds and intact distal pulses.  Exam reveals no gallop and no friction rub.   No murmur heard. Pulmonary/Chest: Effort normal and breath sounds normal. No respiratory distress. She has no wheezes. She has no rales. She exhibits no tenderness.  Abdominal: Soft. Bowel sounds are normal. She exhibits no distension and no mass. There is no tenderness. There is no rebound and no guarding.  Musculoskeletal: Normal range of motion. She exhibits no edema and no tenderness.  Neurological: She is alert and oriented to person, place, and time. She displays normal reflexes. No cranial nerve deficit. She exhibits normal muscle tone. Coordination normal.  Skin: Skin is warm and dry.  Psychiatric: She has a normal mood and affect. Her behavior is normal. Judgment and thought content normal.    ED Course  Procedures (including critical care time)  Labs Reviewed - No data to display No results found.   1. Viral upper respiratory infection   2. Headache       MDM  56 yo F presents with multiple complaints, but mainly fever/chills and frontal headache of 1 day. Pt febrile here, but denies neck stiffness, no alteration of mental status, no focal neuro signs or symptoms, no meningismus, and patiently clearly does not have encephalitis, meningitis, or a brain abscess clinically. No TTP overlying sinuses in this pt with  symptoms of 1 day. Denies cough, dyspnea, and pt satting 100% with clear lung sounds on exam; no concern for pneumonia. No abdominal pain/tenderness, no dysuria. No back pain. No CVA TTP; no concern for UTI or pyelonephritis. Headache resolved with symptomatic treatment.   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 The joint is not swollen, erythematous, or warmer than the rest of her body. Normal passive and active ROM of the joint. Clinical picture not concerning for septic arthritis. Will recommend NSAID's. Patient given return precautions, including worsening of signs or symptoms. Patient instructed to follow-up with primary care physician.         Clemetine Marker, MD 05/04/12 [phone]

## 2012-05-10 NOTE — ED Provider Notes (Signed)
I saw and evaluated the patient, reviewed the resident's note and I agree with the findings and plan.  56yF with fever and general malaise. Suspect viral illness. Nontoxic and nonfocal exam. No signs of meningismus. Lungs clear. RRR w/o murmur. Abdomen benign. Ha resolved with tx. Return precautions discussed.   Raeford Razor, MD 05/10/12 [phone]

## 2012-09-15 ENCOUNTER — Emergency Department (INDEPENDENT_AMBULATORY_CARE_PROVIDER_SITE_OTHER)
Admission: EM | Admit: 2012-09-15 | Discharge: 2012-09-15 | Disposition: A | Discharge status: Home / Self Care | Payer: Self-pay | Source: Home / Self Care | Attending: Emergency Medicine | Admitting: Emergency Medicine

## 2012-09-15 ENCOUNTER — Encounter (HOSPITAL_COMMUNITY): Payer: Self-pay

## 2012-09-15 DIAGNOSIS — I10 Essential (primary) hypertension: Secondary | ICD-10-CM

## 2012-09-15 DIAGNOSIS — Z760 Encounter for issue of repeat prescription: Secondary | ICD-10-CM

## 2012-09-15 MED ORDER — LISINOPRIL-HYDROCHLOROTHIAZIDE 20-12.5 MG PO TABS: 2.0000 | ORAL_TABLET | Freq: Every day | ORAL | Status: DC

## 2012-09-15 MED ORDER — METOPROLOL SUCCINATE ER 200 MG PO TB24: 200.0000 mg | ORAL_TABLET | Freq: Every day | ORAL | Status: DC

## 2012-09-15 MED ORDER — CLONIDINE HCL 0.2 MG PO TABS: 0.2000 mg | ORAL_TABLET | Freq: Every day | ORAL | Status: DC

## 2012-09-15 MED ORDER — VITAMIN B-1 100 MG PO TABS: 100.0000 mg | ORAL_TABLET | Freq: Every day | ORAL | Status: DC

## 2012-09-15 MED ORDER — LORATADINE 10 MG PO TABS: 10.0000 mg | ORAL_TABLET | Freq: Every day | ORAL | Status: DC

## 2012-09-15 MED ORDER — AMLODIPINE BESYLATE 10 MG PO TABS: 10.0000 mg | ORAL_TABLET | Freq: Every day | ORAL | Status: DC

## 2012-09-15 NOTE — ED Provider Notes (Signed)
History     CSN: [id number]  Arrival date & time 09/15/12  7829   First MD Initiated Contact with Patient 09/15/12 [phone]      Chief Complaint  Patient presents with  . Medication Refill    (Consider location/radiation/quality/duration/timing/severity/associated sxs/prior treatment) HPI Comments: Patient is running out of her blood pressure, medicines. She was a [facility] patient, she is attempting to get insurance through the [employer]...deneis any symptoms admits that sometimes she drinks at night, and forgets to take her medicines..  The history is provided by the patient.    Past Medical History  Diagnosis Date  . Hypertension     Past Surgical History  Procedure Date  . Abdominal hysterectomy   . Tubal ligation   . Hemorrhoid surgery     No family history on file.  History  Substance Use Topics  . Smoking status: Current Some Day Smoker  . Smokeless tobacco: Not on file  . Alcohol Use: Yes     Comment: admits to drinking a lot,  states she has not drank in 2 weeks but prior she was heavy drinker    OB History    Grav Para Term Preterm Abortions TAB SAB Ect Mult Living                  Review of Systems  Constitutional: Negative for activity change.  Respiratory: Negative for shortness of breath and wheezing.   Cardiovascular: Negative for chest pain and leg swelling.  Skin: Negative for rash.    Allergies  Triamterene  Home Medications   Current Outpatient Rx  Name  Route  Sig  Dispense  Refill  . ACETAMINOPHEN 500 MG PO TABS   Oral   Take 1,000 mg by mouth every 6 (six) hours as needed. For breathing         . PRENATAL AD PO   Oral   Take by mouth.         . AMLODIPINE BESYLATE 10 MG PO TABS   Oral   Take 1 tablet (10 mg total) by mouth daily.   30 tablet   2   . CLONIDINE HCL 0.2 MG PO TABS   Oral   Take 1 tablet (0.2 mg total) by mouth at bedtime.   30 tablet   2   . LISINOPRIL-HYDROCHLOROTHIAZIDE 20-12.5 MG PO TABS    Oral   Take 2 tablets by mouth daily.   30 tablet   2   . LORATADINE 10 MG PO TABS   Oral   Take 1 tablet (10 mg total) by mouth daily.   30 tablet   1   . METOPROLOL SUCCINATE ER 200 MG PO TB24   Oral   Take 1 tablet (200 mg total) by mouth daily.   30 tablet   2   . VITAMIN B-1 100 MG PO TABS   Oral   Take 1 tablet (100 mg total) by mouth daily.   30 tablet   3     BP 122/81  Pulse 69  Temp 98.5 F (36.9 C) (Oral)  Resp 17  SpO2 99%  Physical Exam  Nursing note and vitals reviewed. Constitutional: Vital signs are normal. She appears well-developed and well-nourished.  Non-toxic appearance. She does not have a sickly appearance.  HENT:  Head: Normocephalic.  Eyes: Conjunctivae normal are normal.  Neck: Normal range of motion. Neck supple.  Cardiovascular: Normal rate.  Exam reveals no gallop and no friction rub.   No murmur heard.  Pulmonary/Chest: Effort normal and breath sounds normal. She has no decreased breath sounds.  Abdominal: She exhibits no distension.    ED Course  Procedures (including critical care time)  Labs Reviewed - No data to display No results found.   1. Encounter for medication refill   2. Hypertension       MDM  Medication- refills, asymptomatic-        Jimmie Molly, MD 09/15/12 1242

## 2012-09-15 NOTE — ED Notes (Signed)
Patient was a previous [facility] patient and needs refills on medication

## 2012-09-24 NOTE — ED Notes (Signed)
Pt called and stated that indigent clinic has misplaced prescription. Pt requested that dr. Ladon Applebaum write prescription for 4 pills to last until Monday - unable to afford entire prescription -is currently taking two other prescriptions for bp. Gave pt 3 options: 1 ) return to [facility] to be reevaluated by another md for new prescription (2) call and ask regular pharm. For 4 pills to last until Monday (3) if symptoms occur or get worse go to ED immediately for further eval.

## 2012-09-25 ENCOUNTER — Telehealth (HOSPITAL_COMMUNITY): Payer: Self-pay | Admitting: Emergency Medicine

## 2012-09-25 NOTE — ED Notes (Signed)
Patient came in today stating she needed an additional RX for Toprol XL.  Patient states she was unable to afford the $42 for the RX.  She called social services and was able to get in touch with someone to help her get the medication filled.  She took the RX to them.  They were supposed to have RX ready yesterday but she was never called.  Patient is now out of medication and does not have original script since she gave is to the nurse getting the medication for her.  RX was written for Toprol XL 200mg  quantity 4.  This will get her through until she can get back in contact with social services.  RX written by Dr Lorenz Coaster and given to patient. Patient's ID verified.

## 2012-09-28 NOTE — ED Notes (Signed)
Pt  Was  Given  A   4  Day  Supply  Of     toprol  xl     3  Days  Ago  From the [facility]   Due  To  No  Financial  Issues  She states  She  Is  A [facility]  Pt    Pt not  Aware       That   [facility]   -    Pt  Was  Given  The  Phone  Number    For  [facility]       Ans d  Will  Call  For  An   appt

## 2012-11-29 ENCOUNTER — Encounter (HOSPITAL_COMMUNITY): Payer: Self-pay

## 2012-11-29 ENCOUNTER — Emergency Department (INDEPENDENT_AMBULATORY_CARE_PROVIDER_SITE_OTHER)
Admission: EM | Admit: 2012-11-29 | Discharge: 2012-11-29 | Disposition: A | Discharge status: Home / Self Care | Payer: No Typology Code available for payment source | Source: Home / Self Care

## 2012-11-29 DIAGNOSIS — D509 Iron deficiency anemia, unspecified: Secondary | ICD-10-CM

## 2012-11-29 DIAGNOSIS — E876 Hypokalemia: Secondary | ICD-10-CM

## 2012-11-29 DIAGNOSIS — K649 Unspecified hemorrhoids: Secondary | ICD-10-CM

## 2012-11-29 DIAGNOSIS — I10 Essential (primary) hypertension: Secondary | ICD-10-CM

## 2012-11-29 DIAGNOSIS — Z9079 Acquired absence of other genital organ(s): Secondary | ICD-10-CM

## 2012-11-29 LAB — CBC
HCT: 39.5 % (ref 36.0–46.0)
Hemoglobin: 13.4 g/dL (ref 12.0–15.0)
MCH: 30 pg (ref 26.0–34.0)
MCHC: 33.9 g/dL (ref 30.0–36.0)
MCV: 88.4 fL (ref 78.0–100.0)
RBC: 4.47 MIL/uL (ref 3.87–5.11)

## 2012-11-29 LAB — LIPID PANEL
Cholesterol: 218 mg/dL — ABNORMAL HIGH (ref 0–200)
Triglycerides: 107 mg/dL (ref <150)
VLDL: 21 mg/dL (ref 0–40)

## 2012-11-29 LAB — COMPREHENSIVE METABOLIC PANEL
Albumin: 4.4 g/dL (ref 3.5–5.2)
Alkaline Phosphatase: 67 U/L (ref 39–117)
BUN: 13 mg/dL (ref 6–23)
Chloride: 98 mEq/L (ref 96–112)
Creatinine, Ser: 0.63 mg/dL (ref 0.50–1.10)
GFR calc Af Amer: >90 mL/min (ref >90)
Glucose, Bld: 94 mg/dL (ref 70–99)
Potassium: 3.1 mEq/L — ABNORMAL LOW (ref 3.5–5.1)
Total Bilirubin: 0.3 mg/dL (ref 0.3–1.2)

## 2012-11-29 MED ORDER — METOPROLOL SUCCINATE ER 200 MG PO TB24: 200.0000 mg | ORAL_TABLET | Freq: Every day | ORAL | Status: DC

## 2012-11-29 MED ORDER — CYCLOBENZAPRINE HCL 10 MG PO TABS: 10.0000 mg | ORAL_TABLET | Freq: Two times a day (BID) | ORAL | Status: DC | PRN

## 2012-11-29 MED ORDER — AMLODIPINE BESYLATE 10 MG PO TABS: 10.0000 mg | ORAL_TABLET | Freq: Every day | ORAL | Status: DC

## 2012-11-29 MED ORDER — POTASSIUM CHLORIDE ER 10 MEQ PO TBCR: 10.0000 meq | EXTENDED_RELEASE_TABLET | Freq: Two times a day (BID) | ORAL | Status: DC

## 2012-11-29 MED ORDER — PRENATAL VIT W/ FE BISG-FA 25-1 MG PO TABS: 1.0000 | ORAL_TABLET | Freq: Every day | ORAL | Status: DC

## 2012-11-29 MED ORDER — LISINOPRIL-HYDROCHLOROTHIAZIDE 20-12.5 MG PO TABS: 2.0000 | ORAL_TABLET | Freq: Every day | ORAL | Status: DC

## 2012-11-29 MED ORDER — VITAMIN B-1 100 MG PO TABS: 100.0000 mg | ORAL_TABLET | Freq: Every day | ORAL | Status: DC

## 2012-11-29 NOTE — ED Notes (Signed)
Has history of HTN-needs medication refill

## 2012-11-29 NOTE — ED Provider Notes (Signed)
History   CSN: [id number]  Arrival date & time 11/29/12  1140  Chief Complaint  Patient presents with  . Medication Refill   HPI Pt presenting to have her BP meds refilled.  Pt having cramping in legs and feet.  Pt has had palpatations.  Pt says that she has run out of her toprolXL and had been given metoprolol tartrate but has only been taking it once per day.  She says that she is having palpatations at night.  Pt says she recently lost her mother from leukemia.  She has been stressed lately.  No chest pain.  No rash.    Past Medical History  Diagnosis Date  . Hypertension     Past Surgical History  Procedure Date  . Abdominal hysterectomy   . Tubal ligation   . Hemorrhoid surgery     No family history on file.  History  Substance Use Topics  . Smoking status: Current Some Day Smoker  . Smokeless tobacco: Not on file  . Alcohol Use: Yes     Comment: admits to drinking a lot,  states she has not drank in 2 weeks but prior she was heavy drinker    OB History    Grav Para Term Preterm Abortions TAB SAB Ect Mult Living                 Review of Systems  Musculoskeletal:       Cramping in the legs and feet  All other systems reviewed and are negative.    Allergies  Triamterene  Home Medications   Current Outpatient Rx  Name  Route  Sig  Dispense  Refill  . CYCLOBENZAPRINE HCL 10 MG PO TABS   Oral   Take 10 mg by mouth 3 (three) times daily as needed.         . ACETAMINOPHEN 500 MG PO TABS   Oral   Take 1,000 mg by mouth every 6 (six) hours as needed. For breathing         . AMLODIPINE BESYLATE 10 MG PO TABS   Oral   Take 1 tablet (10 mg total) by mouth daily.   30 tablet   2   . CLONIDINE HCL 0.2 MG PO TABS   Oral   Take 1 tablet (0.2 mg total) by mouth at bedtime.   30 tablet   2   . LISINOPRIL-HYDROCHLOROTHIAZIDE 20-12.5 MG PO TABS   Oral   Take 2 tablets by mouth daily.   30 tablet   2   . LORATADINE 10 MG PO TABS   Oral   Take 1  tablet (10 mg total) by mouth daily.   30 tablet   1   . METOPROLOL SUCCINATE ER 200 MG PO TB24   Oral   Take 1 tablet (200 mg total) by mouth daily.   30 tablet   2   . PRENATAL AD PO   Oral   Take by mouth.         Marland Kitchen VITAMIN B-1 100 MG PO TABS   Oral   Take 1 tablet (100 mg total) by mouth daily.   30 tablet   3     BP 130/85  Pulse 58  Temp 97.8 F (36.6 C) (Oral)  Resp 18  SpO2 100%  Physical Exam  Nursing note and vitals reviewed. Constitutional: She is oriented to person, place, and time. She appears well-developed and well-nourished. No distress.  HENT:  Head: Normocephalic and atraumatic.  Eyes: EOM are normal. Pupils are equal, round, and reactive to light.  Neck: Normal range of motion. Neck supple.  Cardiovascular: Normal rate, regular rhythm and normal heart sounds.   Pulmonary/Chest: Effort normal and breath sounds normal. No respiratory distress. She has no wheezes. She has no rales. She exhibits no tenderness.  Abdominal: Soft. Bowel sounds are normal.  Musculoskeletal: Normal range of motion. She exhibits no edema and no tenderness.  Neurological: She is alert and oriented to person, place, and time.  Skin: Skin is warm and dry. No rash noted. No erythema. No pallor.  Psychiatric: She has a normal mood and affect. Her behavior is normal. Judgment and thought content normal.    ED Course  Procedures (including critical care time)  Labs Reviewed - No data to display No results found.  No diagnosis found.  MDM  IMPRESSION  HTN  Muscle Spasms   History of Anemia  Colon polyps - repeat   Hypokalemia  palpatations  RECOMMENDATIONS / PLAN Reviewed medical records today, asked to have them scanned into EMR Had a long discussion with patient about beta blockers, pt needs to take them regularly and avoid missing doses Pt declined to continue metoprolol tartrate and opted to continue metoprolol succ ER instead 200 mg po daily, start  potassium supplement 10 mEq by mouth daily Refilled regular medications today  Check labs today:  CBC, CMP, Lipid Panel    FOLLOW UP 3-4 months   The patient was given clear instructions to go to ER or return to medical center if symptoms don't improve, worsen or new problems develop.  The patient verbalized understanding.  The patient was told to call to get lab results if they haven't heard anything in the next week.          Cleora Fleet, MD 11/29/12 1931

## 2012-11-30 NOTE — Progress Notes (Signed)
Quick Note:  Please notify patient that her labs came back okay except for the potassium was decreased and I like for her to take some extra potassium over the next several days to replace her potassium stores. She was given a prescription at the office visit.    Rodney Langton, MD, CDE, FAAFP [facility] [place], [place]   ______

## 2012-12-01 ENCOUNTER — Telehealth (HOSPITAL_COMMUNITY): Payer: Self-pay

## 2012-12-01 NOTE — Telephone Encounter (Signed)
Message copied by Lestine Mount on Wed Dec 01, 2012 10:59 AM ------      Message from: Cleora Fleet      Created: Tue Nov 30, 2012 10:24 AM       Please notify patient that her labs came back okay except for the potassium was decreased and I like for her to take some extra potassium over the next several days to replace her potassium stores.  She was given a prescription at the office visit.                    Rodney Langton, MD, CDE, FAAFP      [facility]      [facility]      [place], [place]

## 2012-12-08 NOTE — ED Notes (Signed)
Pt called  Wanted rx for metoprolol xr changed for something for more affordable-dr johnson spoke with pharmacist @ [facility] prescriptioin changed to metoprolol tartrate 200mg  bid with 3 refills

## 2012-12-31 ENCOUNTER — Other Ambulatory Visit: Payer: Self-pay | Admitting: Family Medicine

## 2013-01-14 MED ORDER — PRENATAL VIT W/ FE BISG-FA 25-1 MG PO TABS: 1.0000 | ORAL_TABLET | Freq: Every day | ORAL | Status: DC

## 2013-01-14 MED ORDER — POTASSIUM CHLORIDE ER 10 MEQ PO TBCR: 10.0000 meq | EXTENDED_RELEASE_TABLET | Freq: Two times a day (BID) | ORAL | Status: DC

## 2013-01-29 ENCOUNTER — Emergency Department (HOSPITAL_COMMUNITY)
Admission: EM | Admit: 2013-01-29 | Discharge: 2013-01-29 | Disposition: A | Discharge status: Home / Self Care | Attending: Emergency Medicine | Admitting: Emergency Medicine

## 2013-01-29 ENCOUNTER — Encounter (HOSPITAL_COMMUNITY): Payer: Self-pay

## 2013-01-29 DIAGNOSIS — I10 Essential (primary) hypertension: Secondary | ICD-10-CM | POA: Insufficient documentation

## 2013-01-29 DIAGNOSIS — R197 Diarrhea, unspecified: Secondary | ICD-10-CM | POA: Insufficient documentation

## 2013-01-29 DIAGNOSIS — Z79899 Other long term (current) drug therapy: Secondary | ICD-10-CM | POA: Insufficient documentation

## 2013-01-29 DIAGNOSIS — F17200 Nicotine dependence, unspecified, uncomplicated: Secondary | ICD-10-CM | POA: Insufficient documentation

## 2013-01-29 DIAGNOSIS — R109 Unspecified abdominal pain: Secondary | ICD-10-CM | POA: Insufficient documentation

## 2013-01-29 DIAGNOSIS — Z90710 Acquired absence of both cervix and uterus: Secondary | ICD-10-CM | POA: Insufficient documentation

## 2013-01-29 LAB — COMPREHENSIVE METABOLIC PANEL
ALT: 20 U/L (ref 0–35)
AST: 23 U/L (ref 0–37)
Albumin: 4.2 g/dL (ref 3.5–5.2)
Calcium: 10.1 mg/dL (ref 8.4–10.5)
Creatinine, Ser: 0.65 mg/dL (ref 0.50–1.10)
Sodium: 140 mEq/L (ref 135–145)
Total Protein: 7.4 g/dL (ref 6.0–8.3)

## 2013-01-29 LAB — CBC WITH DIFFERENTIAL/PLATELET
Basophils Absolute: 0 10*3/uL (ref 0.0–0.1)
Basophils Relative: 1 % (ref 0–1)
Eosinophils Absolute: 0.3 10*3/uL (ref 0.0–0.7)
Eosinophils Relative: 6 % — ABNORMAL HIGH (ref 0–5)
Lymphocytes Relative: 41 % (ref 12–46)
MCHC: 34.2 g/dL (ref 30.0–36.0)
MCV: 88.6 fL (ref 78.0–100.0)
Monocytes Absolute: 0.4 10*3/uL (ref 0.1–1.0)
Platelets: 292 10*3/uL (ref 150–400)
RDW: 12.8 % (ref 11.5–15.5)
WBC: 4.6 10*3/uL (ref 4.0–10.5)

## 2013-01-29 LAB — URINALYSIS, ROUTINE W REFLEX MICROSCOPIC
Glucose, UA: NEGATIVE mg/dL
Hgb urine dipstick: NEGATIVE
Leukocytes, UA: NEGATIVE
Protein, ur: NEGATIVE mg/dL
Specific Gravity, Urine: 1.011 (ref 1.005–1.030)

## 2013-01-29 MED ORDER — FAMOTIDINE 20 MG PO TABS
40.0000 mg | ORAL_TABLET | Freq: Once | ORAL | Status: AC
  Administered 2013-01-29: 40 mg via ORAL
  Filled 2013-01-29: qty 2

## 2013-01-29 MED ORDER — SODIUM CHLORIDE 0.9 % IV BOLUS (SEPSIS)
500.0000 mL | Freq: Once | INTRAVENOUS | Status: AC
  Administered 2013-01-29: 500 mL via INTRAVENOUS

## 2013-01-29 MED ORDER — GI COCKTAIL ~~LOC~~
30.0000 mL | Freq: Once | ORAL | Status: AC
  Administered 2013-01-29: 30 mL via ORAL
  Filled 2013-01-29: qty 30

## 2013-01-29 MED ORDER — FAMOTIDINE 20 MG PO TABS: 20.0000 mg | ORAL_TABLET | Freq: Two times a day (BID) | ORAL | Status: DC

## 2013-01-29 NOTE — ED Provider Notes (Signed)
History     CSN: [id number]  Arrival date & time 01/29/13  0865   First MD Initiated Contact with Patient 01/29/13 0730      Chief Complaint  Patient presents with  . Abdominal Pain    (Consider location/radiation/quality/duration/timing/severity/associated sxs/prior treatment) HPI Comments: Patient with history of binge drinking, hysterectomy (uterus only) -- presents with lower abdominal pain and cramping, heartburn, non-bloody diarrhea for the past 2 days. Patient states that she was binge drinking for 3 days until yesterday morning. She states that she does this as a response to stress and notes that her mother died approximately 3 months ago. No nausea or vomiting. No persistent chest pain. No treatments at home other than goat milk which has helped. Onset of symptoms acute. Course is constant. Nothing makes symptoms worse.  The history is provided by the patient.    Past Medical History  Diagnosis Date  . Hypertension     Past Surgical History  Procedure Laterality Date  . Abdominal hysterectomy    . Tubal ligation    . Hemorrhoid surgery      No family history on file.  History  Substance Use Topics  . Smoking status: Current Some Day Smoker  . Smokeless tobacco: Not on file  . Alcohol Use: Yes     Comment: admits to drinking a lot,  states she has not drank in 2 weeks but prior she was heavy drinker    OB History   Grav Para Term Preterm Abortions TAB SAB Ect Mult Living                  Review of Systems  Constitutional: Negative for fever.  HENT: Negative for sore throat and rhinorrhea.   Eyes: Negative for redness.  Respiratory: Negative for cough.   Cardiovascular: Negative for chest pain.  Gastrointestinal: Positive for abdominal pain and diarrhea. Negative for nausea and vomiting.  Genitourinary: Negative for dysuria.  Musculoskeletal: Negative for myalgias.  Skin: Negative for rash.  Neurological: Negative for headaches.    Allergies   Triamterene  Home Medications   Current Outpatient Rx  Name  Route  Sig  Dispense  Refill  . acetaminophen (TYLENOL) 500 MG tablet   Oral   Take 1,000 mg by mouth every 6 (six) hours as needed. For breathing         . amLODipine (NORVASC) 10 MG tablet   Oral   Take 1 tablet (10 mg total) by mouth daily.   30 tablet   3   . cloNIDine (CATAPRES) 0.2 MG tablet   Oral   Take 1 tablet (0.2 mg total) by mouth at bedtime.   30 tablet   2   . cyclobenzaprine (FLEXERIL) 10 MG tablet   Oral   Take 1 tablet (10 mg total) by mouth 2 (two) times daily as needed for muscle spasms.   30 tablet   2   . lisinopril-hydrochlorothiazide (PRINZIDE,ZESTORETIC) 20-12.5 MG per tablet   Oral   Take 2 tablets by mouth daily.   60 tablet   3   . loratadine (CLARITIN) 10 MG tablet   Oral   Take 1 tablet (10 mg total) by mouth daily.   30 tablet   1   . metoprolol (TOPROL-XL) 200 MG 24 hr tablet   Oral   Take 1 tablet (200 mg total) by mouth daily.   30 tablet   3   . potassium chloride (K-DUR) 10 MEQ tablet   Oral  Take 1 tablet (10 mEq total) by mouth 2 (two) times daily.   30 tablet   3   . Prenatal Vit w/ Fe Bisg-FA 25-1 MG TABS   Oral   Take 1 tablet by mouth daily.   30 each   3   . thiamine (VITAMIN B-1) 100 MG tablet   Oral   Take 1 tablet (100 mg total) by mouth daily.   30 tablet   3     BP 186/96  Pulse 84  Temp(Src) 98.5 F (36.9 C) (Axillary)  Resp 18  SpO2 100%  Physical Exam  Nursing note and vitals reviewed. Constitutional: She appears well-developed and well-nourished.  HENT:  Head: Normocephalic and atraumatic.  Eyes: Conjunctivae are normal. Right eye exhibits no discharge. Left eye exhibits no discharge.  Neck: Normal range of motion. Neck supple.  Cardiovascular: Normal rate, regular rhythm and normal heart sounds.   Pulmonary/Chest: Effort normal and breath sounds normal.  Abdominal: Soft. Bowel sounds are normal. She exhibits no  distension. There is no tenderness. There is no rebound and no guarding.  Neurological: She is alert.  Skin: Skin is warm and dry.  Psychiatric: She has a normal mood and affect.    ED Course  Procedures (including critical care time)  Labs Reviewed  CBC WITH DIFFERENTIAL - Abnormal; Notable for the following:    Eosinophils Relative 6 (*)    All other components within normal limits  COMPREHENSIVE METABOLIC PANEL - Abnormal; Notable for the following:    Potassium 3.4 (*)    Glucose, Bld 116 (*)    All other components within normal limits  URINALYSIS, ROUTINE W REFLEX MICROSCOPIC - Abnormal; Notable for the following:    APPearance CLOUDY (*)    All other components within normal limits  LIPASE, BLOOD   No results found.   1. Abdominal pain   2. Diarrhea     7:59 AM Patient seen and examined. Work-up initiated. Medications ordered.   Vital signs reviewed and are as follows: Filed Vitals:   01/29/13 0737  BP: 186/96  Pulse: 84  Temp: 98.5 F (36.9 C)  Resp: 18   11:42 AM Patient has done well during emergency department stay. Patient informed of all results. Urged alcohol cessation. She feels better after GI cocktail and Pepcid. Exam unchanged.   The patient was urged to return to the Emergency Department immediately with worsening of current symptoms, worsening abdominal pain, persistent vomiting, blood noted in stools, fever, or any other concerns. The patient verbalized understanding.     MDM  Patient with mild abdominal pain, diarrhea. Abdomen is soft and nontender. Recent binge drink. Likely component of alcoholic gastritis. Labs are reassuring. Symptoms improved with GI cocktail and Pepcid. Patient is tolerating liquids without vomiting. She appears well, nontoxic. Will discharge to home with conservative management.        Renne Crigler, PA-C 01/29/13 1145

## 2013-01-29 NOTE — ED Provider Notes (Signed)
Medical screening examination/treatment/procedure(s) were performed by non-physician practitioner and as supervising physician I was immediately available for consultation/collaboration.   Roger Fasnacht, MD 01/29/13 1215 

## 2013-01-29 NOTE — ED Notes (Signed)
Pt has been under a lot of stress lately after her mother passing 3 mths ago and has been binge drinking. She started having mid lower abd pain since Monday and has been nauseated as well as having diarrhea since Thursday. Denies any vomiting.

## 2013-03-07 ENCOUNTER — Other Ambulatory Visit: Payer: Self-pay

## 2013-04-13 ENCOUNTER — Encounter (HOSPITAL_COMMUNITY): Payer: Self-pay | Admitting: Emergency Medicine

## 2013-04-13 ENCOUNTER — Emergency Department (HOSPITAL_COMMUNITY)

## 2013-04-13 ENCOUNTER — Inpatient Hospital Stay (HOSPITAL_COMMUNITY)
Admission: EM | Admit: 2013-04-13 | Discharge: 2013-04-15 | DRG: 390 | Disposition: A | Discharge status: Home / Self Care | Attending: Surgery | Admitting: Surgery

## 2013-04-13 DIAGNOSIS — R109 Unspecified abdominal pain: Secondary | ICD-10-CM

## 2013-04-13 DIAGNOSIS — K56609 Unspecified intestinal obstruction, unspecified as to partial versus complete obstruction: Principal | ICD-10-CM | POA: Diagnosis present

## 2013-04-13 DIAGNOSIS — E876 Hypokalemia: Secondary | ICD-10-CM | POA: Diagnosis present

## 2013-04-13 DIAGNOSIS — F17200 Nicotine dependence, unspecified, uncomplicated: Secondary | ICD-10-CM | POA: Diagnosis present

## 2013-04-13 DIAGNOSIS — R112 Nausea with vomiting, unspecified: Secondary | ICD-10-CM

## 2013-04-13 DIAGNOSIS — D72829 Elevated white blood cell count, unspecified: Secondary | ICD-10-CM | POA: Diagnosis present

## 2013-04-13 DIAGNOSIS — I10 Essential (primary) hypertension: Secondary | ICD-10-CM | POA: Diagnosis present

## 2013-04-13 DIAGNOSIS — F1010 Alcohol abuse, uncomplicated: Secondary | ICD-10-CM | POA: Diagnosis present

## 2013-04-13 LAB — COMPREHENSIVE METABOLIC PANEL
ALT: 15 U/L (ref 0–35)
AST: 21 U/L (ref 0–37)
CO2: 30 mEq/L (ref 19–32)
Chloride: 95 mEq/L — ABNORMAL LOW (ref 96–112)
GFR calc Af Amer: >90 mL/min (ref >90)
GFR calc non Af Amer: >90 mL/min (ref >90)
Glucose, Bld: 202 mg/dL — ABNORMAL HIGH (ref 70–99)
Sodium: 138 mEq/L (ref 135–145)
Total Bilirubin: 0.7 mg/dL (ref 0.3–1.2)

## 2013-04-13 LAB — CBC WITH DIFFERENTIAL/PLATELET
Basophils Absolute: 0 10*3/uL (ref 0.0–0.1)
Eosinophils Relative: 0 % (ref 0–5)
HCT: 41.5 % (ref 36.0–46.0)
Lymphocytes Relative: 12 % (ref 12–46)
Lymphs Abs: 1.3 10*3/uL (ref 0.7–4.0)
MCV: 85.9 fL (ref 78.0–100.0)
Monocytes Absolute: 0.7 10*3/uL (ref 0.1–1.0)
Neutro Abs: 8.7 10*3/uL — ABNORMAL HIGH (ref 1.7–7.7)
Platelets: 353 10*3/uL (ref 150–400)
RBC: 4.83 MIL/uL (ref 3.87–5.11)
RDW: 12.4 % (ref 11.5–15.5)
WBC: 10.8 10*3/uL — ABNORMAL HIGH (ref 4.0–10.5)

## 2013-04-13 LAB — URINALYSIS, ROUTINE W REFLEX MICROSCOPIC
Bilirubin Urine: NEGATIVE
Glucose, UA: 100 mg/dL — AB
Hgb urine dipstick: NEGATIVE
Ketones, ur: 40 mg/dL — AB
Protein, ur: NEGATIVE mg/dL
Urobilinogen, UA: 0.2 mg/dL (ref 0.0–1.0)

## 2013-04-13 MED ORDER — MORPHINE SULFATE 4 MG/ML IJ SOLN
4.0000 mg | Freq: Once | INTRAMUSCULAR | Status: AC
  Administered 2013-04-13: 4 mg via INTRAVENOUS
  Filled 2013-04-13: qty 1

## 2013-04-13 MED ORDER — GI COCKTAIL ~~LOC~~
30.0000 mL | Freq: Once | ORAL | Status: AC
  Administered 2013-04-13: 30 mL via ORAL
  Filled 2013-04-13: qty 30

## 2013-04-13 MED ORDER — IOHEXOL 300 MG/ML  SOLN
100.0000 mL | Freq: Once | INTRAMUSCULAR | Status: AC | PRN
  Administered 2013-04-13: 100 mL via INTRAVENOUS

## 2013-04-13 MED ORDER — SODIUM CHLORIDE 0.9 % IV BOLUS (SEPSIS)
1000.0000 mL | Freq: Once | INTRAVENOUS | Status: AC
  Administered 2013-04-13: 1000 mL via INTRAVENOUS

## 2013-04-13 MED ORDER — IOHEXOL 300 MG/ML  SOLN
25.0000 mL | INTRAMUSCULAR | Status: AC
  Administered 2013-04-13 (×2): 25 mL via ORAL

## 2013-04-13 MED ORDER — ONDANSETRON HCL 4 MG/2ML IJ SOLN
4.0000 mg | Freq: Once | INTRAMUSCULAR | Status: AC
  Administered 2013-04-13: 4 mg via INTRAVENOUS
  Filled 2013-04-13: qty 2

## 2013-04-13 NOTE — H&P (Signed)
Brenda Reyes is an 57 y.o. female.   Chief Complaint: ab pain, nausea/vomiting, consult from Dr Judd Lien HPI: 75 yof with significant etoh and smoking history presents with 24 hours of periumbilical abdominal pain with n/v.  She had a nl bm today and has passed small amount of flatus.  Nothing was making better at home leading her to come to er.  She denies fevers or prior episodes.  Has hx of TAH.   Past Medical History  Diagnosis Date  . Hypertension     Past Surgical History  Procedure Laterality Date  . Abdominal hysterectomy    . Tubal ligation    . Hemorrhoid surgery      No family history on file. Social History:  reports that she has been smoking Cigarettes.  She has been smoking about 0.00 packs per day. She does not have any smokeless tobacco history on file. She reports that  drinks alcohol. She reports that she does not use illicit drugs.  Allergies:  Allergies  Allergen Reactions  . Triamterene Other (See Comments)    Dizziness      (Not in a hospital admission)  Results for orders placed during the hospital encounter of 04/13/13 (from the past 48 hour(s))  CBC WITH DIFFERENTIAL     Status: Abnormal   Collection Time    04/13/13  1:44 PM      Result Value Range   WBC 10.8 (*) 4.0 - 10.5 K/uL   RBC 4.83  3.87 - 5.11 MIL/uL   Hemoglobin 14.8  12.0 - 15.0 g/dL   HCT 41.3  24.4 - 01.0 %   MCV 85.9  78.0 - 100.0 fL   MCH 30.6  26.0 - 34.0 pg   MCHC 35.7  30.0 - 36.0 g/dL   RDW 27.2  53.6 - 64.4 %   Platelets 353  150 - 400 K/uL   Neutrophils Relative % 81 (*) 43 - 77 %   Neutro Abs 8.7 (*) 1.7 - 7.7 K/uL   Lymphocytes Relative 12  12 - 46 %   Lymphs Abs 1.3  0.7 - 4.0 K/uL   Monocytes Relative 7  3 - 12 %   Monocytes Absolute 0.7  0.1 - 1.0 K/uL   Eosinophils Relative 0  0 - 5 %   Eosinophils Absolute 0.0  0.0 - 0.7 K/uL   Basophils Relative 0  0 - 1 %   Basophils Absolute 0.0  0.0 - 0.1 K/uL  COMPREHENSIVE METABOLIC PANEL     Status: Abnormal   Collection Time    04/13/13  1:44 PM      Result Value Range   Sodium 138  135 - 145 mEq/L   Potassium 3.2 (*) 3.5 - 5.1 mEq/L   Chloride 95 (*) 96 - 112 mEq/L   CO2 30  19 - 32 mEq/L   Glucose, Bld 202 (*) 70 - 99 mg/dL   BUN 11  6 - 23 mg/dL   Creatinine, Ser 0.34  0.50 - 1.10 mg/dL   Calcium 74.2  8.4 - 59.5 mg/dL   Total Protein 8.0  6.0 - 8.3 g/dL   Albumin 4.6  3.5 - 5.2 g/dL   AST 21  0 - 37 U/L   ALT 15  0 - 35 U/L   Alkaline Phosphatase 88  39 - 117 U/L   Total Bilirubin 0.7  0.3 - 1.2 mg/dL   GFR calc non Af Amer >90  >90 mL/min   GFR calc Af  Amer >90  >90 mL/min   Comment:            The eGFR has been calculated     using the CKD EPI equation.     This calculation has not been     validated in all clinical     situations.     eGFR's persistently     <90 mL/min signify     possible Chronic Kidney Disease.  LIPASE, BLOOD     Status: None   Collection Time    04/13/13  1:44 PM      Result Value Range   Lipase 27  11 - 59 U/L  URINALYSIS, ROUTINE W REFLEX MICROSCOPIC     Status: Abnormal   Collection Time    04/13/13  8:16 PM      Result Value Range   Color, Urine YELLOW  YELLOW   APPearance CLEAR  CLEAR   Specific Gravity, Urine 1.021  1.005 - 1.030   pH 7.0  5.0 - 8.0   Glucose, UA 100 (*) NEGATIVE mg/dL   Hgb urine dipstick NEGATIVE  NEGATIVE   Bilirubin Urine NEGATIVE  NEGATIVE   Ketones, ur 40 (*) NEGATIVE mg/dL   Protein, ur NEGATIVE  NEGATIVE mg/dL   Urobilinogen, UA 0.2  0.0 - 1.0 mg/dL   Nitrite NEGATIVE  NEGATIVE   Leukocytes, UA NEGATIVE  NEGATIVE   Comment: MICROSCOPIC NOT DONE ON URINES WITH NEGATIVE PROTEIN, BLOOD, LEUKOCYTES, NITRITE, OR GLUCOSE <1000 mg/dL.   Ct Abdomen Pelvis W Contrast  04/13/2013   *RADIOLOGY REPORT*  Clinical Data: Abdominal pain.  Nausea and vomiting.  CT ABDOMEN AND PELVIS WITH CONTRAST  Technique:  Multidetector CT imaging of the abdomen and pelvis was performed following the standard protocol during bolus  administration of intravenous contrast.  Contrast: OMNIPAQUE IOHEXOL 300 MG/ML  SOLN  Comparison: 02/19/2006.  Findings: Lung Bases: Old granulomatous disease.  Liver:  Normal.  Spleen:  Normal.  Gallbladder:  Normal variant phrygian cap.  No inflammatory changes.  Common bile duct:  Normal.  Pancreas:  Normal.  Tiny calcified lymph nodes in the upper abdomen compatible with old granulomatous disease.  Adrenal glands:  Normal.  Kidneys:  Normal enhancement and delayed excretion of contrast. Partially duplicated left renal collecting system.  Stomach:  Normal.  Small bowel:  Small bowel obstruction is present.  There are loops of decompressed terminal ileum in the right lower quadrant.  Normal appendix is also present in the right lower quadrant.  Fecalization of small bowel in the anatomic pelvis adjacent to the transition point.  No mass lesion.  Obstruction is probably adhesive or associated with internal hernia.  Colon:     Accessory spleen noted adjacent to the splenic flexure. Surgical staples are present in the rectum.  Pelvic Genitourinary:  Small volume of ascites.  Urinary bladder appears within normal limits. Hysterectomy.  Bones:  No aggressive osseous lesions.  Vasculature: Normal.  Body Wall: Normal.  IMPRESSION:  1.  High-grade if not complete distal small bowel obstruction with transition point the anatomic pelvis.  Obstruction is probably adhesive or associated with internal hernia. 2.  Hysterectomy. Surgical staples in the rectum. 3.  Small volume of reactive ascites. 4.  Old granulomatous disease.   Original Report Authenticated By: Andreas Newport, M.D.    Review of Systems  Constitutional: Negative for fever and chills.  Respiratory: Negative for cough.   Cardiovascular: Negative for chest pain and palpitations.  Gastrointestinal: Positive for nausea, vomiting and abdominal  pain. Negative for diarrhea and blood in stool.  Genitourinary: Negative for dysuria and urgency.    Blood  pressure 129/89, pulse 82, temperature 98.8 F (37.1 C), resp. rate 19, SpO2 99.00%. Physical Exam  Vitals reviewed. Constitutional: She is oriented to person, place, and time. She appears well-developed and well-nourished.  HENT:  Head: Normocephalic and atraumatic.  Mouth/Throat: Oropharynx is clear and moist.  Neck: Neck supple.  Cardiovascular: Normal rate, regular rhythm and normal heart sounds.   Respiratory: Effort normal and breath sounds normal. She has no wheezes. She has no rales.  GI: She exhibits distension (mild). Bowel sounds are decreased. There is tenderness (very minimal tenderness ) in the periumbilical area. There is no rebound. No hernia.    Lymphadenopathy:    She has no cervical adenopathy.  Neurological: She is alert and oriented to person, place, and time.  Skin: She is not diaphoretic.     Assessment/Plan sbo (partial clinically  Her wbc mildly elevated, ct appears to have sbo.  No indication for surgery now.  Her abdomen is not really tender and ng is functional.  Discussed attempt at conservative mgt. Will repeat films in am, recheck labs in am, npo, ngt.  Discussed role of surgery if she worsens or does not progress.   Arnold Depinto 04/13/2013, 10:12 PM

## 2013-04-13 NOTE — ED Notes (Signed)
PT finished first cup of contrast and CT made aware.

## 2013-04-13 NOTE — ED Notes (Signed)
Pt encouraged to drink contrast in order to complete CT scan.

## 2013-04-13 NOTE — ED Notes (Signed)
Md at bedside explaining CT results.

## 2013-04-13 NOTE — ED Notes (Signed)
Unable to locate patient x2 for room

## 2013-04-13 NOTE — ED Notes (Signed)
General surgery at bedside. 

## 2013-04-13 NOTE — ED Notes (Signed)
Attempted to give reportx1 

## 2013-04-13 NOTE — ED Notes (Signed)
Attempted to give report x2 

## 2013-04-13 NOTE — ED Notes (Signed)
Pt returned to desk and states she was sleeping on a bench outside and would like to still be seen. Encouraged patient to remain in lobby so that she will know when name is called.

## 2013-04-13 NOTE — ED Notes (Signed)
Unable to locate patient x3.

## 2013-04-13 NOTE — ED Notes (Signed)
abd pain n/v since last night 

## 2013-04-13 NOTE — ED Provider Notes (Signed)
History     CSN: [id number]  Arrival date & time 04/13/13  1339   First MD Initiated Contact with Patient 04/13/13 1613      Chief Complaint  Patient presents with  . Abdominal Pain    (Consider location/radiation/quality/duration/timing/severity/associated sxs/prior treatment) HPI Comments: 57 y.o. female w/ pmh of htn and alcohol abuse presents to the Er w/ the cc of abdominal pain. She states she was binge drinking this past weekend, fri/sat/sunday -- stopped and felt she was doing okay after this. Then, she noticed her BP was elevated, and took garlic for this. She states that after she took garlic, she was not feeling well, and she thinks the garlic started her epigastric abdominal pain. She has had vomiting times 4. No fevers or chills w/ this.   Patient is a 57 y.o. female presenting with general illness. The history is provided by the patient.  Illness Severity:  Mild Onset quality:  Gradual Duration:  1 day Timing:  Constant Progression:  Worsening Chronicity:  New Associated symptoms: abdominal pain, nausea and vomiting   Associated symptoms: no chest pain, no cough, no diarrhea, no fatigue, no fever, no headaches, no rash and no wheezing     Past Medical History  Diagnosis Date  . Hypertension     Past Surgical History  Procedure Laterality Date  . Abdominal hysterectomy    . Tubal ligation    . Hemorrhoid surgery      No family history on file.  History  Substance Use Topics  . Smoking status: Current Some Day Smoker    Types: Cigarettes  . Smokeless tobacco: Not on file  . Alcohol Use: Yes     Comment: admits to drinking a lot,  states she has not drank in 2 weeks but prior she was heavy drinker    OB History   Grav Para Term Preterm Abortions TAB SAB Ect Mult Living                  Review of Systems  Constitutional: Negative for fever, chills and fatigue.  HENT: Negative for facial swelling, drooling, neck pain and dental problem.   Eyes:  Negative for pain, discharge and itching.  Respiratory: Negative for cough, choking, wheezing and stridor.   Cardiovascular: Negative for chest pain.  Gastrointestinal: Positive for nausea, vomiting and abdominal pain. Negative for diarrhea.  Endocrine: Negative for cold intolerance and heat intolerance.  Genitourinary: Negative for vaginal discharge, difficulty urinating and vaginal pain.  Skin: Negative for pallor and rash.  Neurological: Negative for dizziness, light-headedness and headaches.  Psychiatric/Behavioral: Negative for behavioral problems and agitation.    Allergies  Triamterene  Home Medications   Current Outpatient Rx  Name  Route  Sig  Dispense  Refill  . amLODipine (NORVASC) 10 MG tablet   Oral   Take 10 mg by mouth daily.         Marland Kitchen aspirin 81 MG tablet   Oral   Take 81 mg by mouth daily as needed (for heart palpations).         . Calcium Carbonate-Vitamin D (CALTRATE 600+D) 600-400 MG-UNIT per tablet   Oral   Take 1 tablet by mouth daily.         . cloNIDine (CATAPRES) 0.2 MG tablet   Oral   Take 0.2 mg by mouth at bedtime.         . famotidine (PEPCID) 20 MG tablet   Oral   Take 1 tablet (20 mg total)  by mouth 2 (two) times daily.   30 tablet   0   . lisinopril-hydrochlorothiazide (PRINZIDE,ZESTORETIC) 20-12.5 MG per tablet   Oral   Take 2 tablets by mouth daily.         Marland Kitchen loratadine (CLARITIN) 10 MG tablet   Oral   Take 10 mg by mouth daily.         . metoprolol (LOPRESSOR) 100 MG tablet   Oral   Take 200 mg by mouth 2 (two) times daily.         . potassium chloride (K-DUR) 10 MEQ tablet   Oral   Take 10 mEq by mouth 2 (two) times daily.         . Prenatal Vit w/ Fe Bisg-FA 25-1 MG TABS   Oral   Take 1 tablet by mouth daily.         Marland Kitchen thiamine (VITAMIN B-1) 100 MG tablet   Oral   Take 100 mg by mouth daily.           BP 126/83  Pulse 81  Temp(Src) 98.8 F (37.1 C)  Resp 19  SpO2 100%  Physical Exam   Constitutional: She is oriented to person, place, and time. She appears well-developed. No distress.  HENT:  Head: Normocephalic and atraumatic.  Eyes: Pupils are equal, round, and reactive to light. Right eye exhibits no discharge. Left eye exhibits no discharge.  Neck: Neck supple. No tracheal deviation present.  Cardiovascular: Normal rate.  Exam reveals no gallop and no friction rub.   Pulmonary/Chest: No stridor. No respiratory distress. She has no wheezes.  Abdominal: Soft. She exhibits no distension. Tenderness: epigastric ttp, and periumbilical ttp  There is no rebound.  Musculoskeletal: She exhibits no edema and no tenderness.  Neurological: She is alert and oriented to person, place, and time.  Skin: Skin is warm. She is not diaphoretic.    ED Course  Procedures (including critical care time)  Labs Reviewed  CBC WITH DIFFERENTIAL - Abnormal; Notable for the following:    WBC 10.8 (*)    Neutrophils Relative % 81 (*)    Neutro Abs 8.7 (*)    All other components within normal limits  COMPREHENSIVE METABOLIC PANEL - Abnormal; Notable for the following:    Potassium 3.2 (*)    Chloride 95 (*)    Glucose, Bld 202 (*)    All other components within normal limits  LIPASE, BLOOD  URINALYSIS, ROUTINE W REFLEX MICROSCOPIC   Ct Abdomen Pelvis W Contrast  04/13/2013   *RADIOLOGY REPORT*  Clinical Data: Abdominal pain.  Nausea and vomiting.  CT ABDOMEN AND PELVIS WITH CONTRAST  Technique:  Multidetector CT imaging of the abdomen and pelvis was performed following the standard protocol during bolus administration of intravenous contrast.  Contrast: OMNIPAQUE IOHEXOL 300 MG/ML  SOLN  Comparison: 02/19/2006.  Findings: Lung Bases: Old granulomatous disease.  Liver:  Normal.  Spleen:  Normal.  Gallbladder:  Normal variant phrygian cap.  No inflammatory changes.  Common bile duct:  Normal.  Pancreas:  Normal.  Tiny calcified lymph nodes in the upper abdomen compatible with old  granulomatous disease.  Adrenal glands:  Normal.  Kidneys:  Normal enhancement and delayed excretion of contrast. Partially duplicated left renal collecting system.  Stomach:  Normal.  Small bowel:  Small bowel obstruction is present.  There are loops of decompressed terminal ileum in the right lower quadrant.  Normal appendix is also present in the right lower quadrant.  Fecalization of  small bowel in the anatomic pelvis adjacent to the transition point.  No mass lesion.  Obstruction is probably adhesive or associated with internal hernia.  Colon:     Accessory spleen noted adjacent to the splenic flexure. Surgical staples are present in the rectum.  Pelvic Genitourinary:  Small volume of ascites.  Urinary bladder appears within normal limits. Hysterectomy.  Bones:  No aggressive osseous lesions.  Vasculature: Normal.  Body Wall: Normal.  IMPRESSION:  1.  High-grade if not complete distal small bowel obstruction with transition point the anatomic pelvis.  Obstruction is probably adhesive or associated with internal hernia. 2.  Hysterectomy. Surgical staples in the rectum. 3.  Small volume of reactive ascites. 4.  Old granulomatous disease.   Original Report Authenticated By: Andreas Newport, M.D.     MDM  Pt with epigastric / periumbilical pain. Will get CT imaging w/ pt's hx of significant alcohol consumption, and w/ pt having ttp on exam.   CT shows small bowel obstruction. Will insert NG. Surgery team is consulted and admitted.   1. Small bowel obstruction             Bernadene Person, MD 04/13/13 2307

## 2013-04-14 ENCOUNTER — Encounter (HOSPITAL_COMMUNITY): Payer: Self-pay | Admitting: *Deleted

## 2013-04-14 ENCOUNTER — Inpatient Hospital Stay (HOSPITAL_COMMUNITY)

## 2013-04-14 DIAGNOSIS — E876 Hypokalemia: Secondary | ICD-10-CM

## 2013-04-14 LAB — BASIC METABOLIC PANEL
BUN: 7 mg/dL (ref 6–23)
GFR calc Af Amer: >90 mL/min (ref >90)
GFR calc non Af Amer: >90 mL/min (ref >90)
Potassium: 3 mEq/L — ABNORMAL LOW (ref 3.5–5.1)
Sodium: 141 mEq/L (ref 135–145)

## 2013-04-14 LAB — CBC
Hemoglobin: 12.8 g/dL (ref 12.0–15.0)
MCHC: 34.6 g/dL (ref 30.0–36.0)
Platelets: 307 10*3/uL (ref 150–400)
RBC: 4.25 MIL/uL (ref 3.87–5.11)

## 2013-04-14 LAB — PROTIME-INR: Prothrombin Time: 12.9 seconds (ref 11.6–15.2)

## 2013-04-14 MED ORDER — ACETAMINOPHEN 650 MG RE SUPP: 650.0000 mg | Freq: Four times a day (QID) | RECTAL | Status: DC | PRN

## 2013-04-14 MED ORDER — ONDANSETRON HCL 4 MG/2ML IJ SOLN: 4.0000 mg | Freq: Four times a day (QID) | INTRAMUSCULAR | Status: DC | PRN

## 2013-04-14 MED ORDER — LORAZEPAM 1 MG PO TABS
1.0000 mg | ORAL_TABLET | Freq: Four times a day (QID) | ORAL | Status: DC | PRN
  Administered 2013-04-15: 1 mg via ORAL
  Filled 2013-04-14: qty 1

## 2013-04-14 MED ORDER — LORAZEPAM 2 MG/ML IJ SOLN: 1.0000 mg | Freq: Four times a day (QID) | INTRAMUSCULAR | Status: DC | PRN

## 2013-04-14 MED ORDER — MORPHINE SULFATE 2 MG/ML IJ SOLN: 2.0000 mg | INTRAMUSCULAR | Status: DC | PRN

## 2013-04-14 MED ORDER — ENOXAPARIN SODIUM 40 MG/0.4ML ~~LOC~~ SOLN
40.0000 mg | SUBCUTANEOUS | Status: DC
  Administered 2013-04-14 – 2013-04-15 (×2): 40 mg via SUBCUTANEOUS
  Filled 2013-04-14 (×3): qty 0.4

## 2013-04-14 MED ORDER — ADULT MULTIVITAMIN W/MINERALS CH
1.0000 | ORAL_TABLET | Freq: Every day | ORAL | Status: DC
  Administered 2013-04-14 – 2013-04-15 (×2): 1 via ORAL
  Filled 2013-04-14 (×2): qty 1

## 2013-04-14 MED ORDER — METOPROLOL TARTRATE 1 MG/ML IV SOLN
5.0000 mg | Freq: Four times a day (QID) | INTRAVENOUS | Status: DC
  Administered 2013-04-14 – 2013-04-15 (×5): 5 mg via INTRAVENOUS
  Filled 2013-04-14 (×8): qty 5

## 2013-04-14 MED ORDER — THIAMINE HCL 100 MG/ML IJ SOLN
100.0000 mg | Freq: Every day | INTRAMUSCULAR | Status: DC
  Filled 2013-04-14: qty 1

## 2013-04-14 MED ORDER — POTASSIUM CHLORIDE IN NACL 20-0.9 MEQ/L-% IV SOLN
INTRAVENOUS | Status: DC
  Administered 2013-04-14 – 2013-04-15 (×3): via INTRAVENOUS
  Filled 2013-04-14 (×7): qty 1000

## 2013-04-14 MED ORDER — ACETAMINOPHEN 325 MG PO TABS: 650.0000 mg | ORAL_TABLET | Freq: Four times a day (QID) | ORAL | Status: DC | PRN

## 2013-04-14 MED ORDER — WHITE PETROLATUM GEL
Status: AC
  Filled 2013-04-14: qty 5

## 2013-04-14 MED ORDER — POTASSIUM CHLORIDE 10 MEQ/100ML IV SOLN
10.0000 meq | INTRAVENOUS | Status: AC
  Administered 2013-04-14 (×3): 10 meq via INTRAVENOUS
  Filled 2013-04-14: qty 200
  Filled 2013-04-14: qty 100

## 2013-04-14 MED ORDER — PHENOL 1.4 % MT LIQD
1.0000 | OROMUCOSAL | Status: DC | PRN
  Filled 2013-04-14: qty 177

## 2013-04-14 MED ORDER — SODIUM CHLORIDE 0.9 % IV SOLN
INTRAVENOUS | Status: DC
  Administered 2013-04-14: 01:00:00 via INTRAVENOUS

## 2013-04-14 MED ORDER — VITAMIN B-1 100 MG PO TABS
100.0000 mg | ORAL_TABLET | Freq: Every day | ORAL | Status: DC
  Administered 2013-04-14 – 2013-04-15 (×2): 100 mg via ORAL
  Filled 2013-04-14 (×2): qty 1

## 2013-04-14 MED ORDER — FOLIC ACID 1 MG PO TABS
1.0000 mg | ORAL_TABLET | Freq: Every day | ORAL | Status: DC
  Administered 2013-04-14 – 2013-04-15 (×2): 1 mg via ORAL
  Filled 2013-04-14 (×2): qty 1

## 2013-04-14 MED ORDER — PANTOPRAZOLE SODIUM 40 MG IV SOLR
40.0000 mg | Freq: Every day | INTRAVENOUS | Status: DC
  Administered 2013-04-14 (×3): 40 mg via INTRAVENOUS
  Filled 2013-04-14 (×4): qty 40

## 2013-04-14 NOTE — Progress Notes (Signed)
Subjective: Pt feels much better today. No N/V, abdominal pain resolved.  +flatus, no BM yet.  Ambulating well.  Hungry.  Urinating well.  Objective: Vital signs in last 24 hours: Temp:  [98.5 F (36.9 C)-98.8 F (37.1 C)] 98.5 F (36.9 C) (06/05 0551) Pulse Rate:  [80-100] 80 (06/05 0551) Resp:  [18-19] 18 (06/05 0551) BP: (126-158)/(78-93) 134/78 mmHg (06/05 0551) SpO2:  [96 %-100 %] 96 % (06/05 0551) Weight:  [146 lb (66.225 kg)] 146 lb (66.225 kg) (06/05 0012) Last BM Date: 04/13/13  Intake/Output from previous day: 06/04 0701 - 06/05 0700 In: 739 [I.V.:739] Out: 1100 [Urine:1000; Emesis/NG output:100] Intake/Output this shift:    PE: Gen:  Alert, NAD, pleasant Abd: Soft, NT/ND, +BS, no HSM   Lab Results:   Recent Labs  04/13/13 1344 04/14/13 0532  WBC 10.8* 8.0  HGB 14.8 12.8  HCT 41.5 37.0  PLT 353 307   BMET  Recent Labs  04/13/13 1344 04/14/13 0532  NA 138 141  K 3.2* 3.0*  CL 95* 101  CO2 30 29  GLUCOSE 202* 101*  BUN 11 7  CREATININE 0.70 0.65  CALCIUM 10.3 9.2   PT/INR  Recent Labs  04/14/13 0532  LABPROT 12.9  INR 0.98   CMP     Component Value Date/Time   NA 141 04/14/2013 0532   K 3.0* 04/14/2013 0532   CL 101 04/14/2013 0532   CO2 29 04/14/2013 0532   GLUCOSE 101* 04/14/2013 0532   BUN 7 04/14/2013 0532   CREATININE 0.65 04/14/2013 0532   CALCIUM 9.2 04/14/2013 0532   PROT 8.0 04/13/2013 1344   ALBUMIN 4.6 04/13/2013 1344   AST 21 04/13/2013 1344   ALT 15 04/13/2013 1344   ALKPHOS 88 04/13/2013 1344   BILITOT 0.7 04/13/2013 1344   GFRNONAA >90 04/14/2013 0532   GFRAA >90 04/14/2013 0532   Lipase     Component Value Date/Time   LIPASE 27 04/13/2013 1344       Studies/Results: Ct Abdomen Pelvis W Contrast  04/13/2013   *RADIOLOGY REPORT*  Clinical Data: Abdominal pain.  Nausea and vomiting.  CT ABDOMEN AND PELVIS WITH CONTRAST  Technique:  Multidetector CT imaging of the abdomen and pelvis was performed following the standard protocol during  bolus administration of intravenous contrast.  Contrast: OMNIPAQUE IOHEXOL 300 MG/ML  SOLN  Comparison: 02/19/2006.  Findings: Lung Bases: Old granulomatous disease.  Liver:  Normal.  Spleen:  Normal.  Gallbladder:  Normal variant phrygian cap.  No inflammatory changes.  Common bile duct:  Normal.  Pancreas:  Normal.  Tiny calcified lymph nodes in the upper abdomen compatible with old granulomatous disease.  Adrenal glands:  Normal.  Kidneys:  Normal enhancement and delayed excretion of contrast. Partially duplicated left renal collecting system.  Stomach:  Normal.  Small bowel:  Small bowel obstruction is present.  There are loops of decompressed terminal ileum in the right lower quadrant.  Normal appendix is also present in the right lower quadrant.  Fecalization of small bowel in the anatomic pelvis adjacent to the transition point.  No mass lesion.  Obstruction is probably adhesive or associated with internal hernia.  Colon:     Accessory spleen noted adjacent to the splenic flexure. Surgical staples are present in the rectum.  Pelvic Genitourinary:  Small volume of ascites.  Urinary bladder appears within normal limits. Hysterectomy.  Bones:  No aggressive osseous lesions.  Vasculature: Normal.  Body Wall: Normal.  IMPRESSION:  1.  High-grade if  not complete distal small bowel obstruction with transition point the anatomic pelvis.  Obstruction is probably adhesive or associated with internal hernia. 2.  Hysterectomy. Surgical staples in the rectum. 3.  Small volume of reactive ascites. 4.  Old granulomatous disease.   Original Report Authenticated By: Andreas Newport, M.D.    Anti-infectives: Anti-infectives   None       Assessment/Plan pSBO 1.  Will clamp NG tube, clamping trials, allow clear sips & ice chips 2.  Ambulate and IS 3.  SCD's and Lovenox 4.  Hopefully d/c NG today and start clears  Hypokalemia - 3.0 today, supplemented ETOH use - start CIWA protocol    LOS: 1 day     DORT, Suzann Lazaro 04/14/2013, 7:28 AM Pager: [phone]

## 2013-04-14 NOTE — ED Provider Notes (Signed)
I saw and evaluated the patient, reviewed the resident's note and I agree with the findings and plan. The patient presents to the ED with complaints of abdominal pain and vomiting.  She reports having consumed excessive quantities of alcohol over the weekend.  The pain is primarily in the upper abdomen and is quite severe.  No fevers or chills.    On exam, the patient appears uncomfortable.  She is afebrile and the vitals are stable.  The heart is regular rate and rhythm and the lungs are clear.  The abdomen is remarkable for ttp in the epigastric region without rebound or guarding.  It is somewhat distended and tympanitic.  Bowel sounds are audible.    The workup reveals what appears to be a small bowel obstruction.  An NG tube will be placed and surgery will be consulted for admission.  Geoffery Lyons, MD 04/14/13 [id number]

## 2013-04-14 NOTE — Progress Notes (Signed)
Seen and agree looks better.try to clamp NGT.

## 2013-04-14 NOTE — Progress Notes (Signed)
Utilization review completed. Terryn Redner, RN, BSN. 

## 2013-04-15 LAB — BASIC METABOLIC PANEL
BUN: 7 mg/dL (ref 6–23)
Creatinine, Ser: 0.6 mg/dL (ref 0.50–1.10)
GFR calc Af Amer: >90 mL/min (ref >90)
GFR calc non Af Amer: >90 mL/min (ref >90)

## 2013-04-15 MED ORDER — METOPROLOL SUCCINATE ER 100 MG PO TB24
200.0000 mg | ORAL_TABLET | Freq: Every day | ORAL | Status: DC
  Administered 2013-04-15: 200 mg via ORAL
  Filled 2013-04-15 (×2): qty 1

## 2013-04-15 MED ORDER — HYDROCHLOROTHIAZIDE 25 MG PO TABS
25.0000 mg | ORAL_TABLET | Freq: Every day | ORAL | Status: DC
  Administered 2013-04-15: 25 mg via ORAL
  Filled 2013-04-15: qty 1

## 2013-04-15 MED ORDER — LISINOPRIL 40 MG PO TABS
40.0000 mg | ORAL_TABLET | Freq: Every day | ORAL | Status: DC
  Administered 2013-04-15: 40 mg via ORAL
  Filled 2013-04-15: qty 1

## 2013-04-15 MED ORDER — AMLODIPINE BESYLATE 10 MG PO TABS
10.0000 mg | ORAL_TABLET | Freq: Every day | ORAL | Status: DC
  Administered 2013-04-15: 10 mg via ORAL
  Filled 2013-04-15: qty 1

## 2013-04-15 NOTE — Discharge Summary (Signed)
Resolved SBO.   discharge

## 2013-04-15 NOTE — Progress Notes (Signed)
Looking better.  Home today.

## 2013-04-15 NOTE — Discharge Summary (Signed)
Physician Discharge Summary  Patient ID: Brenda Reyes MRN: [id number] DOB/AGE: 1956/10/12 57 y.o.  Admit date: 04/13/2013 Discharge date: 04/15/2013  Admitting Diagnosis: SBO Abdominal pain Nausea/vomiting Leukocytosis  Discharge Diagnosis Patient Active Problem List   Diagnosis Date Noted  . SBO (small bowel obstruction) 04/13/2013  . TOBACCO USER 11/15/2009  . URI 11/15/2009  . FIBROIDS, UTERUS 04/19/2007  . ANEMIA, IRON DEFICIENCY NOS 04/19/2007  . ABUSE, ALCOHOL, UNSPECIFIED 04/19/2007  . HYPERTENSION, ESSENTIAL NOS 04/19/2007  . HEMORRHOIDS 04/19/2007  . HYSTERECTOMY, HX OF 09/10/2006    Consultants None  Imaging: Ct Abdomen Pelvis W Contrast  04/13/2013   *RADIOLOGY REPORT*  Clinical Data: Abdominal pain.  Nausea and vomiting.  CT ABDOMEN AND PELVIS WITH CONTRAST  Technique:  Multidetector CT imaging of the abdomen and pelvis was performed following the standard protocol during bolus administration of intravenous contrast.  Contrast: OMNIPAQUE IOHEXOL 300 MG/ML  SOLN  Comparison: 02/19/2006.  Findings: Lung Bases: Old granulomatous disease.  Liver:  Normal.  Spleen:  Normal.  Gallbladder:  Normal variant phrygian cap.  No inflammatory changes.  Common bile duct:  Normal.  Pancreas:  Normal.  Tiny calcified lymph nodes in the upper abdomen compatible with old granulomatous disease.  Adrenal glands:  Normal.  Kidneys:  Normal enhancement and delayed excretion of contrast. Partially duplicated left renal collecting system.  Stomach:  Normal.  Small bowel:  Small bowel obstruction is present.  There are loops of decompressed terminal ileum in the right lower quadrant.  Normal appendix is also present in the right lower quadrant.  Fecalization of small bowel in the anatomic pelvis adjacent to the transition point.  No mass lesion.  Obstruction is probably adhesive or associated with internal hernia.  Colon:     Accessory spleen noted adjacent to the splenic flexure. Surgical  staples are present in the rectum.  Pelvic Genitourinary:  Small volume of ascites.  Urinary bladder appears within normal limits. Hysterectomy.  Bones:  No aggressive osseous lesions.  Vasculature: Normal.  Body Wall: Normal.  IMPRESSION:  1.  High-grade if not complete distal small bowel obstruction with transition point the anatomic pelvis.  Obstruction is probably adhesive or associated with internal hernia. 2.  Hysterectomy. Surgical staples in the rectum. 3.  Small volume of reactive ascites. 4.  Old granulomatous disease.   Original Report Authenticated By: Andreas Newport, M.D.   Dg Abd Portable 2v  04/14/2013   *RADIOLOGY REPORT*  Clinical Data: Small bowel obstruction, follow-up  PORTABLE ABDOMEN - 2 VIEW  Comparison: CT abdomen pelvis of 04/13/2013  Findings: Portable supine and left lateral decubitus films of the abdomen were obtained.  There is contrast scattered throughout the nondistended colon.  Only a few loops of small bowel are noted within the right lower quadrant with slight gaseous distention but no definite partial small bowel obstruction is evident.  An NG tube is noted with the tip in the region of the fundus of the stomach. A probable granuloma is noted medially at the right lung base.  IMPRESSION: No definite small bowel obstruction with only minimal gaseous distention of small bowel loops in the right lower quadrant.  The colon is decompressed.   Original Report Authenticated By: Dwyane Dee, M.D.    Procedures None  Hospital Course:  74 yof with significant etoh and smoking history presents with 24 hours of periumbilical abdominal pain with n/v. She had a nl bm today and has passed small amount of flatus. Nothing was making better at home  leading her to come to [facility]. She denies fevers or prior episodes. Has hx of TAH.  Workup showed SBO, clinically a pSBO.  Patient was admitted and treated conservatively for 2 days.  She experienced hypokalemia due to vomiting.  Her K was  supplemented and improved upon resolution of vomiting and after starting to eat.  Started to improve on HD #1 and #2.  Bowel function returned and NG tube was discontinued on HD #3.  Diet was advanced as tolerated.  On HD #3, the patient was voiding well, tolerating diet, ambulating well, pain well controlled, vital signs stable, and felt stable for discharge home.  Patient will follow up in our office as needed and knows to call with questions or concerns.      Medication List    TAKE these medications       amLODipine 10 MG tablet  Commonly known as:  NORVASC  Take 10 mg by mouth daily.     CALTRATE 600+D 600-400 MG-UNIT per tablet  Generic drug:  Calcium Carbonate-Vitamin D  Take 1 tablet by mouth daily.     cloNIDine 0.2 MG tablet  Commonly known as:  CATAPRES  Take 0.2 mg by mouth at bedtime.     cyclobenzaprine 5 MG tablet  Commonly known as:  FLEXERIL  Take 5 mg by mouth daily as needed for muscle spasms.     lisinopril-hydrochlorothiazide 20-12.5 MG per tablet  Commonly known as:  PRINZIDE,ZESTORETIC  Take 2 tablets by mouth daily.     loratadine 10 MG tablet  Commonly known as:  CLARITIN  Take 10 mg by mouth daily.     meloxicam 15 MG tablet  Commonly known as:  MOBIC  Take 15 mg by mouth daily as needed for pain.     metoprolol 200 MG 24 hr tablet  Commonly known as:  TOPROL-XL  Take 200 mg by mouth daily.     potassium chloride 10 MEQ tablet  Commonly known as:  K-DUR  Take 10 mEq by mouth daily.     Prenatal Vit w/ Fe Bisg-FA 25-1 MG Tabs  Take 1 tablet by mouth daily.     thiamine 100 MG tablet  Commonly known as:  VITAMIN B-1  Take 100 mg by mouth daily.             Follow-up Information   Call [facility],MD, MD. (As needed)    Contact information:   [address] [place] [address] [phone]       Follow up with Norberto Sorenson, MD. (F/U WITH YOUR PRIMARY CARE PROVIDER  ABOUT YOUR RECENT HOSPITALIZATION)    Contact information:    [address] [place] [address] [phone]       Signed: Candiss Norse [facility] [phone]  04/15/2013, 9:36 AM

## 2013-04-15 NOTE — Progress Notes (Signed)
Subjective: Pt feels fine.  No pain, N/V.  Ambulating well, hungry, wants to go home.  NG tube was never discontinued yesterday, and clamping trials never performed even though ordered.  Discussed with RN this am about checking residuals, removing tube and starting on clears.   Objective: Vital signs in last 24 hours: Temp:  [97.6 F (36.4 C)-98.5 F (36.9 C)] 97.8 F (36.6 C) (06/06 0600) Pulse Rate:  [66-82] 82 (06/06 0600) Resp:  [18-20] 18 (06/06 0600) BP: (132-156)/(78-92) 156/91 mmHg (06/06 0600) SpO2:  [99 %-100 %] 100 % (06/06 0600) Last BM Date: 04/13/13  Intake/Output from previous day: 06/05 0701 - 06/06 0700 In: 0  Out: 2400 [Urine:2400] Intake/Output this shift:    PE: Gen:  Alert, NAD, pleasant Abd: Soft, NT/ND, +BS, no HSM   Lab Results:   Recent Labs  04/13/13 1344 04/14/13 0532  WBC 10.8* 8.0  HGB 14.8 12.8  HCT 41.5 37.0  PLT 353 307   BMET  Recent Labs  04/13/13 1344 04/14/13 0532  NA 138 141  K 3.2* 3.0*  CL 95* 101  CO2 30 29  GLUCOSE 202* 101*  BUN 11 7  CREATININE 0.70 0.65  CALCIUM 10.3 9.2   PT/INR  Recent Labs  04/14/13 0532  LABPROT 12.9  INR 0.98   CMP     Component Value Date/Time   NA 141 04/14/2013 0532   K 3.0* 04/14/2013 0532   CL 101 04/14/2013 0532   CO2 29 04/14/2013 0532   GLUCOSE 101* 04/14/2013 0532   BUN 7 04/14/2013 0532   CREATININE 0.65 04/14/2013 0532   CALCIUM 9.2 04/14/2013 0532   PROT 8.0 04/13/2013 1344   ALBUMIN 4.6 04/13/2013 1344   AST 21 04/13/2013 1344   ALT 15 04/13/2013 1344   ALKPHOS 88 04/13/2013 1344   BILITOT 0.7 04/13/2013 1344   GFRNONAA >90 04/14/2013 0532   GFRAA >90 04/14/2013 0532   Lipase     Component Value Date/Time   LIPASE 27 04/13/2013 1344       Studies/Results: Ct Abdomen Pelvis W Contrast  04/13/2013   *RADIOLOGY REPORT*  Clinical Data: Abdominal pain.  Nausea and vomiting.  CT ABDOMEN AND PELVIS WITH CONTRAST  Technique:  Multidetector CT imaging of the abdomen and pelvis was  performed following the standard protocol during bolus administration of intravenous contrast.  Contrast: OMNIPAQUE IOHEXOL 300 MG/ML  SOLN  Comparison: 02/19/2006.  Findings: Lung Bases: Old granulomatous disease.  Liver:  Normal.  Spleen:  Normal.  Gallbladder:  Normal variant phrygian cap.  No inflammatory changes.  Common bile duct:  Normal.  Pancreas:  Normal.  Tiny calcified lymph nodes in the upper abdomen compatible with old granulomatous disease.  Adrenal glands:  Normal.  Kidneys:  Normal enhancement and delayed excretion of contrast. Partially duplicated left renal collecting system.  Stomach:  Normal.  Small bowel:  Small bowel obstruction is present.  There are loops of decompressed terminal ileum in the right lower quadrant.  Normal appendix is also present in the right lower quadrant.  Fecalization of small bowel in the anatomic pelvis adjacent to the transition point.  No mass lesion.  Obstruction is probably adhesive or associated with internal hernia.  Colon:     Accessory spleen noted adjacent to the splenic flexure. Surgical staples are present in the rectum.  Pelvic Genitourinary:  Small volume of ascites.  Urinary bladder appears within normal limits. Hysterectomy.  Bones:  No aggressive osseous lesions.  Vasculature: Normal.  Body  Wall: Normal.  IMPRESSION:  1.  High-grade if not complete distal small bowel obstruction with transition point the anatomic pelvis.  Obstruction is probably adhesive or associated with internal hernia. 2.  Hysterectomy. Surgical staples in the rectum. 3.  Small volume of reactive ascites. 4.  Old granulomatous disease.   Original Report Authenticated By: Andreas Newport, M.D.   Dg Abd Portable 2v  04/14/2013   *RADIOLOGY REPORT*  Clinical Data: Small bowel obstruction, follow-up  PORTABLE ABDOMEN - 2 VIEW  Comparison: CT abdomen pelvis of 04/13/2013  Findings: Portable supine and left lateral decubitus films of the abdomen were obtained.  There is contrast  scattered throughout the nondistended colon.  Only a few loops of small bowel are noted within the right lower quadrant with slight gaseous distention but no definite partial small bowel obstruction is evident.  An NG tube is noted with the tip in the region of the fundus of the stomach. A probable granuloma is noted medially at the right lung base.  IMPRESSION: No definite small bowel obstruction with only minimal gaseous distention of small bowel loops in the right lower quadrant.  The colon is decompressed.   Original Report Authenticated By: Dwyane Dee, M.D.    Anti-infectives: Anti-infectives   None       Assessment/Plan pSBO  1. Check residuals and d/c NG tube if less than , allow clears, then soft diet at lunch, and restart home meds 2.  Hopefully can go home today 3.  SCD's and lovenox 4.  Ambulate and IS 5.  D/C after lunch  Hypokalemia - 3.0 yesterday, supplemented, recheck today prior to D/C ETOH use - cont. CIWA protocol     LOS: 2 days    DORT, Aundra Millet 04/15/2013, 8:24 AM Pager: [phone]

## 2013-04-30 ENCOUNTER — Emergency Department (HOSPITAL_COMMUNITY)

## 2013-04-30 ENCOUNTER — Emergency Department (HOSPITAL_COMMUNITY)
Admission: EM | Admit: 2013-04-30 | Discharge: 2013-04-30 | Disposition: A | Discharge status: Home / Self Care | Attending: Emergency Medicine | Admitting: Emergency Medicine

## 2013-04-30 DIAGNOSIS — R634 Abnormal weight loss: Secondary | ICD-10-CM | POA: Insufficient documentation

## 2013-04-30 DIAGNOSIS — Z79899 Other long term (current) drug therapy: Secondary | ICD-10-CM | POA: Insufficient documentation

## 2013-04-30 DIAGNOSIS — F17200 Nicotine dependence, unspecified, uncomplicated: Secondary | ICD-10-CM | POA: Insufficient documentation

## 2013-04-30 DIAGNOSIS — I10 Essential (primary) hypertension: Secondary | ICD-10-CM | POA: Insufficient documentation

## 2013-04-30 DIAGNOSIS — Z90710 Acquired absence of both cervix and uterus: Secondary | ICD-10-CM | POA: Insufficient documentation

## 2013-04-30 DIAGNOSIS — Z9851 Tubal ligation status: Secondary | ICD-10-CM | POA: Insufficient documentation

## 2013-04-30 LAB — COMPREHENSIVE METABOLIC PANEL
Albumin: 4 g/dL (ref 3.5–5.2)
Alkaline Phosphatase: 67 U/L (ref 39–117)
BUN: 11 mg/dL (ref 6–23)
Calcium: 9.3 mg/dL (ref 8.4–10.5)
Potassium: 3.5 mEq/L (ref 3.5–5.1)
Sodium: 140 mEq/L (ref 135–145)
Total Protein: 7.1 g/dL (ref 6.0–8.3)

## 2013-04-30 LAB — URINALYSIS, ROUTINE W REFLEX MICROSCOPIC
Bilirubin Urine: NEGATIVE
Glucose, UA: NEGATIVE mg/dL
Hgb urine dipstick: NEGATIVE
Ketones, ur: NEGATIVE mg/dL
Protein, ur: NEGATIVE mg/dL
pH: 8 (ref 5.0–8.0)

## 2013-04-30 LAB — CBC WITH DIFFERENTIAL/PLATELET
Basophils Relative: 1 % (ref 0–1)
Eosinophils Absolute: 0.3 10*3/uL (ref 0.0–0.7)
Eosinophils Relative: 7 % — ABNORMAL HIGH (ref 0–5)
MCH: 30.1 pg (ref 26.0–34.0)
MCHC: 34.3 g/dL (ref 30.0–36.0)
Monocytes Relative: 7 % (ref 3–12)
Neutrophils Relative %: 30 % — ABNORMAL LOW (ref 43–77)
Platelets: 301 10*3/uL (ref 150–400)

## 2013-04-30 LAB — RAPID URINE DRUG SCREEN, HOSP PERFORMED
Amphetamines: NOT DETECTED
Benzodiazepines: NOT DETECTED
Cocaine: NOT DETECTED
Opiates: NOT DETECTED

## 2013-04-30 LAB — ETHANOL: Alcohol, Ethyl (B): 66 mg/dL — ABNORMAL HIGH (ref 0–11)

## 2013-04-30 MED ORDER — SODIUM CHLORIDE 0.9 % IV BOLUS (SEPSIS)
1000.0000 mL | Freq: Once | INTRAVENOUS | Status: AC
  Administered 2013-04-30: 1000 mL via INTRAVENOUS

## 2013-04-30 NOTE — ED Notes (Signed)
Pt tolerating fluid well. Ambulatory to bathroom. Requesting to weigh herself.

## 2013-04-30 NOTE — ED Notes (Signed)
Pt states that she was here recently for a SBO and states that she hasn't felt right since then. Pt also states that she has been losing weight

## 2013-04-30 NOTE — ED Notes (Signed)
PA at bedside.

## 2013-04-30 NOTE — ED Notes (Signed)
Family at bedside. 

## 2013-04-30 NOTE — ED Provider Notes (Signed)
Medical screening examination/treatment/procedure(s) were performed by non-physician practitioner and as supervising physician I was immediately available for consultation/collaboration.   Richardean Canal, MD 04/30/13 [phone]

## 2013-04-30 NOTE — ED Provider Notes (Signed)
History     CSN: [id number]  Arrival date & time 04/30/13  [id number]   First MD Initiated Contact with Patient 04/30/13 [phone]      Chief Complaint  Patient presents with  . Abdominal Pain    (Consider location/radiation/quality/duration/timing/severity/associated sxs/prior treatment) HPI Comments: Patient presents to the ED with a chief complaint of "feeling funny."  Patient states that she was admitted 2.5 weeks ago for SBO, and that she has not been feeling right since.  She states that "it seems like my food disappears."  She reports having regular BMs and denies any fever, chills, nausea, vomiting, constipation, or diarrhea.  She denies having abdominal pain.  She states that what is most concerning is that she has lost "2 lbs" in the past 2 weeks.  She states that she had a lot to drink last night.  She has close follow-up with her PCP, and is scheduled for another f/u appointment in 2-3 weeks.  The history is provided by the patient. No language interpreter was used.    Past Medical History  Diagnosis Date  . Hypertension     Past Surgical History  Procedure Laterality Date  . Abdominal hysterectomy    . Tubal ligation    . Hemorrhoid surgery      No family history on file.  History  Substance Use Topics  . Smoking status: Current Some Day Smoker -- 0.25 packs/day    Types: Cigarettes  . Smokeless tobacco: Not on file  . Alcohol Use: Yes     Comment: admits to drinking a lot,  states she has not drank in 2 weeks but prior she was heavy drinker    OB History   Grav Para Term Preterm Abortions TAB SAB Ect Mult Living                  Review of Systems  All other systems reviewed and are negative.    Allergies  Triamterene  Home Medications   Current Outpatient Rx  Name  Route  Sig  Dispense  Refill  . amLODipine (NORVASC) 10 MG tablet   Oral   Take 10 mg by mouth daily.         . Calcium Carbonate-Vitamin D (CALTRATE 600+D) 600-400 MG-UNIT per tablet    Oral   Take 1 tablet by mouth daily.         . cloNIDine (CATAPRES) 0.2 MG tablet   Oral   Take 0.2 mg by mouth at bedtime.         . cyclobenzaprine (FLEXERIL) 5 MG tablet   Oral   Take 5 mg by mouth daily as needed for muscle spasms.         Marland Kitchen lisinopril-hydrochlorothiazide (PRINZIDE,ZESTORETIC) 20-12.5 MG per tablet   Oral   Take 2 tablets by mouth daily.         Marland Kitchen loratadine (CLARITIN) 10 MG tablet   Oral   Take 10 mg by mouth daily.         . meloxicam (MOBIC) 15 MG tablet   Oral   Take 15 mg by mouth daily as needed for pain.         . metoprolol (TOPROL-XL) 200 MG 24 hr tablet   Oral   Take 200 mg by mouth daily.         . potassium chloride (K-DUR) 10 MEQ tablet   Oral   Take 10 mEq by mouth daily.          Marland Kitchen  Prenatal Vit w/ Fe Bisg-FA 25-1 MG TABS   Oral   Take 1 tablet by mouth daily.         Marland Kitchen thiamine (VITAMIN B-1) 100 MG tablet   Oral   Take 100 mg by mouth daily.           BP 135/83  Temp(Src) 98.3 F (36.8 C) (Oral)  Resp 18  Ht 5\' 6"  (1.676 m)  Wt 146 lb (66.225 kg)  BMI 23.58 kg/m2  SpO2 97%  Physical Exam  Nursing note and vitals reviewed. Constitutional: She is oriented to person, place, and time. She appears well-developed and well-nourished.  HENT:  Head: Normocephalic and atraumatic.  Eyes: Conjunctivae and EOM are normal. Pupils are equal, round, and reactive to light.  Neck: Normal range of motion. Neck supple.  Cardiovascular: Normal rate and regular rhythm.  Exam reveals no gallop and no friction rub.   No murmur heard. Pulmonary/Chest: Effort normal and breath sounds normal. No respiratory distress. She has no wheezes. She has no rales. She exhibits no tenderness.  Abdominal: Soft. Bowel sounds are normal. She exhibits no distension and no mass. There is no tenderness. There is no rebound and no guarding.  Musculoskeletal: Normal range of motion. She exhibits no edema and no tenderness.  Neurological: She is  alert and oriented to person, place, and time.  Skin: Skin is warm and dry.  Psychiatric: She has a normal mood and affect. Her behavior is normal. Judgment and thought content normal.    ED Course  Procedures (including critical care time)  Labs Reviewed  LIPASE, BLOOD  CBC WITH DIFFERENTIAL  COMPREHENSIVE METABOLIC PANEL  ETHANOL  URINE RAPID DRUG SCREEN (HOSP PERFORMED)  URINALYSIS, ROUTINE W REFLEX MICROSCOPIC   Results for orders placed during the hospital encounter of 04/30/13  LIPASE, BLOOD      Result Value Range   Lipase 53  11 - 59 U/L  CBC WITH DIFFERENTIAL      Result Value Range   WBC 3.9 (*) 4.0 - 10.5 K/uL   RBC 4.25  3.87 - 5.11 MIL/uL   Hemoglobin 12.8  12.0 - 15.0 g/dL   HCT 09.8  11.9 - 14.7 %   MCV 87.8  78.0 - 100.0 fL   MCH 30.1  26.0 - 34.0 pg   MCHC 34.3  30.0 - 36.0 g/dL   RDW 82.9  56.2 - 13.0 %   Platelets 301  150 - 400 K/uL   Neutrophils Relative % 30 (*) 43 - 77 %   Neutro Abs 1.2 (*) 1.7 - 7.7 K/uL   Lymphocytes Relative 55 (*) 12 - 46 %   Lymphs Abs 2.2  0.7 - 4.0 K/uL   Monocytes Relative 7  3 - 12 %   Monocytes Absolute 0.3  0.1 - 1.0 K/uL   Eosinophils Relative 7 (*) 0 - 5 %   Eosinophils Absolute 0.3  0.0 - 0.7 K/uL   Basophils Relative 1  0 - 1 %   Basophils Absolute 0.0  0.0 - 0.1 K/uL  COMPREHENSIVE METABOLIC PANEL      Result Value Range   Sodium 140  135 - 145 mEq/L   Potassium 3.5  3.5 - 5.1 mEq/L   Chloride 101  96 - 112 mEq/L   CO2 28  19 - 32 mEq/L   Glucose, Bld 106 (*) 70 - 99 mg/dL   BUN 11  6 - 23 mg/dL   Creatinine, Ser 8.65  0.50 - 1.10  mg/dL   Calcium 9.3  8.4 - 16.1 mg/dL   Total Protein 7.1  6.0 - 8.3 g/dL   Albumin 4.0  3.5 - 5.2 g/dL   AST 19  0 - 37 U/L   ALT 13  0 - 35 U/L   Alkaline Phosphatase 67  39 - 117 U/L   Total Bilirubin 0.3  0.3 - 1.2 mg/dL   GFR calc non Af Amer >90  >90 mL/min   GFR calc Af Amer >90  >90 mL/min  ETHANOL      Result Value Range   Alcohol, Ethyl (B) 66 (*) 0 - 11 mg/dL   URINE RAPID DRUG SCREEN (HOSP PERFORMED)      Result Value Range   Opiates NONE DETECTED  NONE DETECTED   Cocaine NONE DETECTED  NONE DETECTED   Benzodiazepines NONE DETECTED  NONE DETECTED   Amphetamines NONE DETECTED  NONE DETECTED   Tetrahydrocannabinol NONE DETECTED  NONE DETECTED   Barbiturates NONE DETECTED  NONE DETECTED  URINALYSIS, ROUTINE W REFLEX MICROSCOPIC      Result Value Range   Color, Urine YELLOW  YELLOW   APPearance CLEAR  CLEAR   Specific Gravity, Urine 1.008  1.005 - 1.030   pH 8.0  5.0 - 8.0   Glucose, UA NEGATIVE  NEGATIVE mg/dL   Hgb urine dipstick NEGATIVE  NEGATIVE   Bilirubin Urine NEGATIVE  NEGATIVE   Ketones, ur NEGATIVE  NEGATIVE mg/dL   Protein, ur NEGATIVE  NEGATIVE mg/dL   Urobilinogen, UA 0.2  0.0 - 1.0 mg/dL   Nitrite NEGATIVE  NEGATIVE   Leukocytes, UA NEGATIVE  NEGATIVE   Ct Abdomen Pelvis W Contrast  04/13/2013   *RADIOLOGY REPORT*  Clinical Data: Abdominal pain.  Nausea and vomiting.  CT ABDOMEN AND PELVIS WITH CONTRAST  Technique:  Multidetector CT imaging of the abdomen and pelvis was performed following the standard protocol during bolus administration of intravenous contrast.  Contrast: OMNIPAQUE IOHEXOL 300 MG/ML  SOLN  Comparison: 02/19/2006.  Findings: Lung Bases: Old granulomatous disease.  Liver:  Normal.  Spleen:  Normal.  Gallbladder:  Normal variant phrygian cap.  No inflammatory changes.  Common bile duct:  Normal.  Pancreas:  Normal.  Tiny calcified lymph nodes in the upper abdomen compatible with old granulomatous disease.  Adrenal glands:  Normal.  Kidneys:  Normal enhancement and delayed excretion of contrast. Partially duplicated left renal collecting system.  Stomach:  Normal.  Small bowel:  Small bowel obstruction is present.  There are loops of decompressed terminal ileum in the right lower quadrant.  Normal appendix is also present in the right lower quadrant.  Fecalization of small bowel in the anatomic pelvis adjacent to  the transition point.  No mass lesion.  Obstruction is probably adhesive or associated with internal hernia.  Colon:     Accessory spleen noted adjacent to the splenic flexure. Surgical staples are present in the rectum.  Pelvic Genitourinary:  Small volume of ascites.  Urinary bladder appears within normal limits. Hysterectomy.  Bones:  No aggressive osseous lesions.  Vasculature: Normal.  Body Wall: Normal.  IMPRESSION:  1.  High-grade if not complete distal small bowel obstruction with transition point the anatomic pelvis.  Obstruction is probably adhesive or associated with internal hernia. 2.  Hysterectomy. Surgical staples in the rectum. 3.  Small volume of reactive ascites. 4.  Old granulomatous disease.   Original Report Authenticated By: Andreas Newport, M.D.   Dg Abd Acute W/chest  04/30/2013   *  RADIOLOGY REPORT*  Clinical Data: Abdominal pain and weight loss.  ACUTE ABDOMEN SERIES (ABDOMEN 2 VIEW & CHEST 1 VIEW)  Comparison: 04/14/2013  Findings: There is increase stool the colon, but no evidence of obstruction or generalized adynamic ileus.  There is no free air. The bowel anastomose the staple line lies in the low central pelvis.  A single round density lies to the right of the T11-T12 disc space, which may reflect a dense right medial lung base granuloma.  It is stable.  The soft tissues are otherwise unremarkable.  The chest radiograph demonstrates a normal heart, mediastinum and hila and clear lungs.  IMPRESSION: No acute findings.  No evidence of a bowel obstruction or free air.  Increase stool the colon.   Original Report Authenticated By: Amie Portland, M.D.   Dg Abd Portable 2v  04/14/2013   *RADIOLOGY REPORT*  Clinical Data: Small bowel obstruction, follow-up  PORTABLE ABDOMEN - 2 VIEW  Comparison: CT abdomen pelvis of 04/13/2013  Findings: Portable supine and left lateral decubitus films of the abdomen were obtained.  There is contrast scattered throughout the nondistended colon.  Only a few  loops of small bowel are noted within the right lower quadrant with slight gaseous distention but no definite partial small bowel obstruction is evident.  An NG tube is noted with the tip in the region of the fundus of the stomach. A probable granuloma is noted medially at the right lung base.  IMPRESSION: No definite small bowel obstruction with only minimal gaseous distention of small bowel loops in the right lower quadrant.  The colon is decompressed.   Original Report Authenticated By: Dwyane Dee, M.D.      1. Weight loss       MDM  Patient with prior SBO.  Will check labs, urine, and abdominal series.  Doubt obstruction, as the patient has been eating normally, and having regular BMs.  Also, no abdominal pain, or n/v/d.  Patient's main concern is 2 lb weight loss in the past two weeks.  8:26 AM Abdomen remains benign.  No evidence of SBO on plain films.  Labs are unremarkable.  Will discharge the patient to home with strict return precautions.  Follow-up with PCP.  Discussed the patient with Dr. Silverio Lay, who agrees with the plan.  Patient is stable and ready for discharge.        Roxy Horseman, PA-C 04/30/13 [phone]

## 2013-04-30 NOTE — ED Notes (Signed)
Patient given fluid challenge and drinking water at this moment. No N/V/D or distress noted. Will re-evaluate in 15 minutes.

## 2013-07-01 ENCOUNTER — Other Ambulatory Visit (HOSPITAL_COMMUNITY): Payer: Self-pay | Admitting: Family Medicine

## 2013-07-01 DIAGNOSIS — R1310 Dysphagia, unspecified: Secondary | ICD-10-CM

## 2013-07-04 ENCOUNTER — Ambulatory Visit (HOSPITAL_COMMUNITY)
Admission: RE | Admit: 2013-07-04 | Discharge: 2013-07-04 | Disposition: A | Discharge status: Home / Self Care | Source: Ambulatory Visit | Attending: Family Medicine | Admitting: Family Medicine

## 2013-07-04 DIAGNOSIS — R1310 Dysphagia, unspecified: Secondary | ICD-10-CM | POA: Insufficient documentation

## 2014-04-05 ENCOUNTER — Encounter: Payer: Self-pay | Admitting: Physician Assistant

## 2014-04-05 ENCOUNTER — Ambulatory Visit (INDEPENDENT_AMBULATORY_CARE_PROVIDER_SITE_OTHER): Admitting: Physician Assistant

## 2014-04-05 VITALS — BP 120/76 | HR 72 | Ht 66.0 in | Wt 144.0 lb

## 2014-04-05 DIAGNOSIS — R141 Gas pain: Secondary | ICD-10-CM

## 2014-04-05 DIAGNOSIS — Z860101 Personal history of adenomatous and serrated colon polyps: Secondary | ICD-10-CM

## 2014-04-05 DIAGNOSIS — Z86010 Personal history of colonic polyps: Secondary | ICD-10-CM

## 2014-04-05 DIAGNOSIS — R143 Flatulence: Secondary | ICD-10-CM

## 2014-04-05 DIAGNOSIS — R140 Abdominal distension (gaseous): Secondary | ICD-10-CM

## 2014-04-05 DIAGNOSIS — R142 Eructation: Secondary | ICD-10-CM

## 2014-04-05 NOTE — Patient Instructions (Signed)
Call if your having more frequent episodes of abdominal swelling, and discomfort.  We will put in a recall date for a colonoscopy for February 2016.

## 2014-04-05 NOTE — Progress Notes (Signed)
Subjective:    Patient ID: Brenda Reyes, female    DOB: [birth year], 58 y.o.   MRN: [id number]  HPI Cristella is a pleasant 58 year old African American female known to Dr. Deatra Ina. Patient has history of hypertension prior EtOH abuse, colon polyps and was hospitalized in June of 2014 with a partial small bowel obstruction. This was a distal small bowel obstruction which resolved with conservative management she was seen by the surgeons at that time and not GI.  Last colonoscopy was done in 2011 with a 4 mm sessile polyp removed from the splenic flexure also noted to have an internal hemorrhoid. Path was consistent with a tubular adenoma. Patient comes in today for evaluation of intermittent abdominal distention. She says that ever since she had the obstruction she's not been quite right. She did lose some weight during that hospitalization and had a hard time getting the way back which seems to concern her. She says he forced herself to eeat a lot and was able to gain weight but has lost it again. Over the past 6 months her weight has been very stable. She has intermittent episodes of abdominal swelling and bloating and had an episode last week. She says generally these last for a day and not associated with any nausea or vomiting and her bowel movements have been normal. In between these episodes she can E. without difficulty and is not having any changes in her bowel habits melena or hematochezia. Her only prior abdominal surgery was hysterectomy.    Review of Systems  Constitutional: Negative.   HENT: Negative.   Eyes: Negative.   Respiratory: Negative.   Cardiovascular: Negative.   Gastrointestinal: Positive for abdominal distention.  Endocrine: Negative.   Genitourinary: Negative.   Musculoskeletal: Negative.   Allergic/Immunologic: Negative.   Neurological: Negative.   Hematological: Negative.   Psychiatric/Behavioral: Negative.    Outpatient Prescriptions Prior to Visit    Medication Sig Dispense Refill  . amLODipine (NORVASC) 10 MG tablet Take 10 mg by mouth daily.      . Calcium Carbonate-Vitamin D (CALTRATE 600+D) 600-400 MG-UNIT per tablet Take 1 tablet by mouth daily.      . cloNIDine (CATAPRES) 0.2 MG tablet Take 0.2 mg by mouth at bedtime.      . cyclobenzaprine (FLEXERIL) 5 MG tablet Take 5 mg by mouth daily as needed for muscle spasms.      Marland Kitchen lisinopril-hydrochlorothiazide (PRINZIDE,ZESTORETIC) 20-12.5 MG per tablet Take 2 tablets by mouth daily.      Marland Kitchen loratadine (CLARITIN) 10 MG tablet Take 10 mg by mouth daily.      . meloxicam (MOBIC) 15 MG tablet Take 15 mg by mouth daily as needed for pain.      . metoprolol (TOPROL-XL) 200 MG 24 hr tablet Take 200 mg by mouth daily.      . potassium chloride (K-DUR) 10 MEQ tablet Take 10 mEq by mouth once.       . Prenatal Vit w/ Fe Bisg-FA 25-1 MG TABS Take 1 tablet by mouth daily.      Marland Kitchen thiamine (VITAMIN B-1) 100 MG tablet Take 100 mg by mouth daily.       No facility-administered medications prior to visit.   Allergies  Allergen Reactions  . Triamterene Other (See Comments)    Dizziness    Patient Active Problem List   Diagnosis Date Noted  . SBO (small bowel obstruction) 04/13/2013  . TOBACCO USER 11/15/2009  . URI 11/15/2009  . FIBROIDS, UTERUS  04/19/2007  . ANEMIA, IRON DEFICIENCY NOS 04/19/2007  . ABUSE, ALCOHOL, UNSPECIFIED 04/19/2007  . HYPERTENSION, ESSENTIAL NOS 04/19/2007  . HEMORRHOIDS 04/19/2007  . HYSTERECTOMY, HX OF 09/10/2006     History  Substance Use Topics  . Smoking status: Current Some Day Smoker -- 0.25 packs/day    Types: Cigarettes  . Smokeless tobacco: Never Used  . Alcohol Use: Yes     Comment: admits to drinking a lot,  states she has not drank in 2 weeks but prior she was heavy drinker   Allergies  Allergen Reactions  . Triamterene Other (See Comments)    Dizziness        Objective:   Physical Exam well-developed African American female in no acute  distress, pleasant blood pressure 120/76 pulse 72 height 5 foot 6 weight 144. HEENT ;nontraumatic normocephalic EOMI PERRLA sclera anicteric, Neck; supple no JVD, Cardiovascula;r regular rate and rhythm with S1-S2 no murmur or gallop, Ulnar clear bilaterally, Abdomen; soft nondistended nontender no palpable mass or hepatosplenomegaly bowel sounds are present, Rectal; exam not done, Extremities ;no clubbing cyanosis or edema skin warm and dry, Psych ;mood and affect appropriate        Assessment & Plan:  #4  58 year old female with history of a tubular adenomatous polyp on colonoscopy 2011 due for followup early 2016 #2 Intermittent abdominal bloating and distention with history of partial distal small bowel obstruction June 2014 felt secondary to internal hernia or adhesions. This episode resolved with conservative management. Etiology of intermittent bloating may be due to intermittent low-grade partial obstruction. She is currently asymptomatic #3 weight issue-patient lost weight with partial small bowel obstruction last summer but since that time but weight has been stable.  Plan; Patient will be sent a reminder for colonoscopy scheduling for 2016 with Dr. Deatra Ina Discussed CT scan with the patient however she is currently asymptomatic and likely this will be low yield. She is comfortable with observation and is advised to call if she is having repeated episodes or more prolonged episodes and will obtain a CT scan of the abdomen and pelvis at that time.

## 2014-04-06 NOTE — Progress Notes (Signed)
Reviewed and agree with management. Kourtney Terriquez D. Gannon Heinzman, M.D., FACG  

## 2014-04-06 NOTE — Progress Notes (Signed)
Reviewed and agree with management. Tyffany Waldrop D. Gyan Cambre, M.D., FACG  

## 2014-08-15 ENCOUNTER — Telehealth: Payer: Self-pay | Admitting: Physician Assistant

## 2014-08-15 ENCOUNTER — Encounter: Payer: Self-pay | Admitting: *Deleted

## 2014-08-15 NOTE — Telephone Encounter (Signed)
Patient states she saw Nicoletta Ba, PA several months ago. She is calling to report she is having a "fullness feeling" after eating and pain. If she lays on her stomach the pain is less. She spoke with Dr. Criss Rosales and was told to see her GI for this. Scheduled with Nicoletta Ba, PA at 2:30 PM on 08/16/14.

## 2014-08-16 ENCOUNTER — Other Ambulatory Visit (INDEPENDENT_AMBULATORY_CARE_PROVIDER_SITE_OTHER)

## 2014-08-16 ENCOUNTER — Encounter: Payer: Self-pay | Admitting: Physician Assistant

## 2014-08-16 ENCOUNTER — Ambulatory Visit (INDEPENDENT_AMBULATORY_CARE_PROVIDER_SITE_OTHER): Admitting: Physician Assistant

## 2014-08-16 VITALS — BP 140/80 | HR 80 | Ht 65.5 in | Wt 146.1 lb

## 2014-08-16 DIAGNOSIS — R140 Abdominal distension (gaseous): Secondary | ICD-10-CM

## 2014-08-16 DIAGNOSIS — R1084 Generalized abdominal pain: Secondary | ICD-10-CM

## 2014-08-16 DIAGNOSIS — Z86010 Personal history of colonic polyps: Secondary | ICD-10-CM

## 2014-08-16 DIAGNOSIS — Z8719 Personal history of other diseases of the digestive system: Secondary | ICD-10-CM

## 2014-08-16 LAB — CBC WITH DIFFERENTIAL/PLATELET
BASOS PCT: 0.5 % (ref 0.0–3.0)
Basophils Absolute: 0 10*3/uL (ref 0.0–0.1)
EOS ABS: 0.5 10*3/uL (ref 0.0–0.7)
Eosinophils Relative: 8.4 % — ABNORMAL HIGH (ref 0.0–5.0)
HEMATOCRIT: 40.3 % (ref 36.0–46.0)
HEMOGLOBIN: 13.6 g/dL (ref 12.0–15.0)
LYMPHS ABS: 2 10*3/uL (ref 0.7–4.0)
LYMPHS PCT: 33.1 % (ref 12.0–46.0)
MCHC: 33.8 g/dL (ref 30.0–36.0)
MCV: 89.5 fl (ref 78.0–100.0)
Monocytes Absolute: 0.6 10*3/uL (ref 0.1–1.0)
Monocytes Relative: 10.2 % (ref 3.0–12.0)
NEUTROS ABS: 2.9 10*3/uL (ref 1.4–7.7)
Neutrophils Relative %: 47.8 % (ref 43.0–77.0)
Platelets: 312 10*3/uL (ref 150.0–400.0)
RBC: 4.5 Mil/uL (ref 3.87–5.11)
RDW: 12.7 % (ref 11.5–15.5)
WBC: 6 10*3/uL (ref 4.0–10.5)

## 2014-08-16 LAB — BASIC METABOLIC PANEL
BUN: 16 mg/dL (ref 6–23)
CHLORIDE: 101 meq/L (ref 96–112)
CO2: 27 mEq/L (ref 19–32)
CREATININE: 0.8 mg/dL (ref 0.4–1.2)
Calcium: 10.3 mg/dL (ref 8.4–10.5)
GFR: 99.61 mL/min (ref >60.00)
Glucose, Bld: 115 mg/dL — ABNORMAL HIGH (ref 70–99)
Potassium: 3.5 mEq/L (ref 3.5–5.1)
Sodium: 139 mEq/L (ref 135–145)

## 2014-08-16 NOTE — Patient Instructions (Signed)
Please go to the basement level to have your labs drawn.  Follow a full liquid diet until after the CT scan. Brochure given.    You have been scheduled for a CT scan of the abdomen and pelvis at [facility] ([address]---this is in the same building as Press [facility]).   You are scheduled on Thursday 10-8-2015at 2:30 PM . You should arrive at 2:15 Pm. Please follow the written instructions below on the day of your exam:  WARNING: IF YOU ARE ALLERGIC TO IODINE/X-RAY DYE, PLEASE NOTIFY RADIOLOGY IMMEDIATELY AT [phone]! YOU WILL BE GIVEN A 13 HOUR PREMEDICATION PREP.  1) Do not eat or drink anything after 10:30 am  (4 hours prior to your test) 2) You have been given 2 bottles of oral contrast to drink. The solution may taste  better if refrigerated, but do NOT add ice or any other liquid to this solution. Shake well before drinking.    Drink 1 bottle of contrast @ 12:30 PM  (2 hours prior to your exam)  Drink 1 bottle of contrast @ 1:30 PM  (1 hour prior to your exam)  You may take any medications as prescribed with a small amount of water except for the following: Metformin, Glucophage, Glucovance, Avandamet, Riomet, Fortamet, Actoplus Met, Janumet, Glumetza or Metaglip. The above medications must be held the day of the exam AND 48 hours after the exam.  The purpose of you drinking the oral contrast is to aid in the visualization of your intestinal tract. The contrast solution may cause some diarrhea. Before your exam is started, you will be given a small amount of fluid to drink. Depending on your individual set of symptoms, you may also receive an intravenous injection of x-ray contrast/dye. Plan on being at [facility] for 30 minutes or long, depending on the type of exam you are having performed.  If you have any questions regarding your exam or if you need to reschedule, you may call the CT department at [phone] between the hours of 8:00 am and 5:00 pm,  Monday-Friday.  ________________________________________________________________________

## 2014-08-16 NOTE — Progress Notes (Signed)
Patient ID: Brenda Reyes, female   DOB: January 14, 1956, 58 y.o.   MRN: [id number]      HPI : 58 yo AA female with a hx of ETOH abuse, colon polyps, HTN. She is known to Dr Deatra Ina and was hospitalized in June 2014 with a partial SBO which was felt to be due to adhesions or . She was treated conservatively with IV fluids and bowel rest. Since that time she says she has never felt "100%". Over the past two weeks she has had increased abdominal distention and blaoting, and diffuse abdominal discomfort that has been intensifying. She has no nausea or vomitng, but says her discomfort is exacerbated with eating. She is passing gas. Her bowel movements have been formed with occasional loose stools. No BRBPR. No melena. No GU symptoms.    Past Medical History  Diagnosis Date  . Hypertension   . Colon polyps 12/27/2009    Tubular adenomatous polyps  . Internal hemorrhoids   . Borderline diabetes       Family History  Problem Relation Age of Onset  . Colon cancer Neg Hx   . Stomach cancer Neg Hx   . Throat cancer Father    History  Substance Use Topics  . Smoking status: Current Some Day Smoker -- 0.25 packs/day    Types: Cigarettes  . Smokeless tobacco: Never Used  . Alcohol Use: Yes     Comment: admits to drinking a lot,  states she has not drank in 2 weeks but prior she was heavy drinker   Current Outpatient Prescriptions  Medication Sig Dispense Refill  . amLODipine (NORVASC) 10 MG tablet Take 10 mg by mouth daily.      . Calcium Carbonate-Vitamin D (CALTRATE 600+D) 600-400 MG-UNIT per tablet Take 1 tablet by mouth daily.      . cloNIDine (CATAPRES) 0.2 MG tablet Take 0.2 mg by mouth at bedtime.      . cyclobenzaprine (FLEXERIL) 5 MG tablet Take 5 mg by mouth daily as needed for muscle spasms.      Marland Kitchen lisinopril-hydrochlorothiazide (PRINZIDE,ZESTORETIC) 20-12.5 MG per tablet Take 2 tablets by mouth daily.      Marland Kitchen loratadine (CLARITIN) 10 MG tablet Take 10 mg by mouth daily.      .  meloxicam (MOBIC) 15 MG tablet Take 15 mg by mouth daily as needed for pain.      . metoprolol (TOPROL-XL) 200 MG 24 hr tablet Take 200 mg by mouth daily.      . potassium chloride (K-DUR) 10 MEQ tablet Take 10 mEq by mouth once.       . Prenatal Vit w/ Fe Bisg-FA 25-1 MG TABS Take 1 tablet by mouth daily.      Marland Kitchen thiamine (VITAMIN B-1) 100 MG tablet Take 100 mg by mouth daily.       No current facility-administered medications for this visit.   Allergies  Allergen Reactions  . Triamterene Other (See Comments)    Dizziness      Review of Systems: All systems reviewed and negative except where noted in HPI.    Physical Exam: BP 140/80  Pulse 80  Ht 5' 5.5" (1.664 m)  Wt 146 lb 2 oz (66.282 kg)  BMI 23.94 kg/m2 Constitutional: Pleasant,well-developed, AA female in no acute distress. HEENT: Normocephalic and atraumatic. Conjunctivae are normal. No scleral icterus. Neck supple.  Cardiovascular: Normal rate, regular rhythm.  Pulmonary/chest: Effort normal and breath sounds normal. No wheezing, rales or rhonchi. Abdominal: Soft, mild distention, mild  diffuse tenderness Bowel sounds active throughout. No rushes or tinkles. Not tympanitic.  There are no masses palpable. No hepatomegaly. Extremities: no edema Lymphadenopathy: No cervical adenopathy noted. Neurological: Alert and oriented to person place and time. Skin: Skin is warm and dry. No rashes noted. Psychiatric: Normal mood and affect. Behavior is normal.   ASSESSMENT AND PLAN:  1. Abdominal bloating with distention with a history of partial SBO. Current bloating and discomfort may be due to recurrent partial SBO. Pt instructed to adhere to full liquid diet. BMET and CBC ordered. Will obtain abd/pelvic  CT to assess for  SBO.Further recommendations pending results of CT scan. 2. Hx adenomatous polyp on colonoscopy 2011. Due for surveillance colonoscopy in 2016.

## 2014-08-17 ENCOUNTER — Ambulatory Visit (INDEPENDENT_AMBULATORY_CARE_PROVIDER_SITE_OTHER)
Admission: RE | Admit: 2014-08-17 | Discharge: 2014-08-17 | Disposition: A | Discharge status: Home / Self Care | Source: Ambulatory Visit | Attending: Physician Assistant | Admitting: Physician Assistant

## 2014-08-17 DIAGNOSIS — Z8719 Personal history of other diseases of the digestive system: Secondary | ICD-10-CM

## 2014-08-17 DIAGNOSIS — R1084 Generalized abdominal pain: Secondary | ICD-10-CM

## 2014-08-17 DIAGNOSIS — R140 Abdominal distension (gaseous): Secondary | ICD-10-CM

## 2014-08-17 MED ORDER — IOHEXOL 300 MG/ML  SOLN
100.0000 mL | Freq: Once | INTRAMUSCULAR | Status: AC | PRN
  Administered 2014-08-17: 100 mL via INTRAVENOUS

## 2014-08-17 NOTE — Progress Notes (Signed)
Reviewed and agree with management. Carlis Blanchard D. Alferd Obryant, M.D., FACG  

## 2014-12-14 ENCOUNTER — Encounter: Payer: Self-pay | Admitting: Gastroenterology

## 2014-12-22 ENCOUNTER — Other Ambulatory Visit (HOSPITAL_COMMUNITY): Payer: Self-pay | Admitting: Family Medicine

## 2014-12-22 DIAGNOSIS — E038 Other specified hypothyroidism: Secondary | ICD-10-CM

## 2014-12-26 ENCOUNTER — Encounter (HOSPITAL_COMMUNITY)
Admission: RE | Admit: 2014-12-26 | Discharge: 2014-12-26 | Disposition: A | Discharge status: Home / Self Care | Source: Ambulatory Visit | Attending: Family Medicine | Admitting: Family Medicine

## 2014-12-26 DIAGNOSIS — E0590 Thyrotoxicosis, unspecified without thyrotoxic crisis or storm: Secondary | ICD-10-CM | POA: Insufficient documentation

## 2014-12-26 DIAGNOSIS — E038 Other specified hypothyroidism: Secondary | ICD-10-CM

## 2014-12-26 DIAGNOSIS — R634 Abnormal weight loss: Secondary | ICD-10-CM | POA: Insufficient documentation

## 2014-12-26 MED ORDER — SODIUM IODIDE I 131 CAPSULE
15.7900 | Freq: Once | INTRAVENOUS | Status: AC | PRN
  Administered 2014-12-26: 15.79 via ORAL

## 2014-12-27 ENCOUNTER — Encounter (HOSPITAL_COMMUNITY)
Admission: RE | Admit: 2014-12-27 | Discharge: 2014-12-27 | Disposition: A | Discharge status: Home / Self Care | Source: Ambulatory Visit | Attending: Family Medicine | Admitting: Family Medicine

## 2014-12-27 DIAGNOSIS — E0590 Thyrotoxicosis, unspecified without thyrotoxic crisis or storm: Secondary | ICD-10-CM | POA: Diagnosis not present

## 2014-12-27 DIAGNOSIS — R634 Abnormal weight loss: Secondary | ICD-10-CM | POA: Diagnosis not present

## 2014-12-27 MED ORDER — SODIUM PERTECHNETATE TC 99M INJECTION
10.0000 | Freq: Once | INTRAVENOUS | Status: AC | PRN
  Administered 2014-12-27: 10 via INTRAVENOUS

## 2015-01-26 ENCOUNTER — Telehealth: Payer: Self-pay | Admitting: Gastroenterology

## 2015-01-26 NOTE — Telephone Encounter (Signed)
She wants a letter for her lawyer explaining the procedure and sedation. He plans to have the case rescheduled, but the letter would be helpful. Letter faxed to Mr. Thomas Maddox [phone].

## 2015-01-31 ENCOUNTER — Ambulatory Visit (AMBULATORY_SURGERY_CENTER): Payer: Self-pay | Admitting: *Deleted

## 2015-01-31 VITALS — Ht 65.5 in | Wt 145.0 lb

## 2015-01-31 DIAGNOSIS — Z86010 Personal history of colonic polyps: Secondary | ICD-10-CM

## 2015-01-31 MED ORDER — NA SULFATE-K SULFATE-MG SULF 17.5-3.13-1.6 GM/177ML PO SOLN: ORAL | Status: DC

## 2015-01-31 NOTE — Progress Notes (Signed)
Patient denies any allergies to eggs or soy. Patient denies any problems with anesthesia/sedation. Patient denies any oxygen use at home and does not take any diet/weight loss medications. EMMI education assisgned to patient on colonoscopy, this was explained and instructions given to patient. 

## 2015-02-08 ENCOUNTER — Ambulatory Visit (INDEPENDENT_AMBULATORY_CARE_PROVIDER_SITE_OTHER)

## 2015-02-08 ENCOUNTER — Ambulatory Visit (INDEPENDENT_AMBULATORY_CARE_PROVIDER_SITE_OTHER): Admitting: Podiatry

## 2015-02-08 DIAGNOSIS — M722 Plantar fascial fibromatosis: Secondary | ICD-10-CM

## 2015-02-08 DIAGNOSIS — M79672 Pain in left foot: Secondary | ICD-10-CM

## 2015-02-08 MED ORDER — TRIAMCINOLONE ACETONIDE 10 MG/ML IJ SUSP
10.0000 mg | Freq: Once | INTRAMUSCULAR | Status: AC
  Administered 2015-02-08: 10 mg

## 2015-02-08 NOTE — Progress Notes (Signed)
   Subjective:    Patient ID: Brenda Reyes, female    DOB: 1956/01/10, 59 y.o.   MRN: [id number]  HPI  Pt presents with left foot pain, knot on the bottom of her foot, she states she has a pair of old orthotics and is requesting a new pair  Review of Systems  All other systems reviewed and are negative.      Objective:   Physical Exam        Assessment & Plan:

## 2015-02-08 NOTE — Progress Notes (Signed)
Subjective:     Patient ID: Brenda Reyes, female   DOB: 09-09-1956, 59 y.o.   MRN: [id number]  HPI patient presents with a not in the plantar of the left arch that's been painful for the last month. Does not remember specific injury and states she's had orthotics 57 years old which it still been helpful for her   Review of Systems  All other systems reviewed and are negative.      Objective:   Physical Exam  Constitutional: She is oriented to person, place, and time.  Cardiovascular: Intact distal pulses.   Musculoskeletal: Normal range of motion.  Neurological: She is oriented to person, place, and time.  Skin: Skin is warm.  Nursing note and vitals reviewed.  neurovascular status intact with muscle strength adequate range of motion subtalar midtarsal joint within normal limits. Digits are well perfused patient's found to be well oriented and I noted in the medial distal arch area a 5 x 5 mm nodule that is painful when pressed and appears to be movable     Assessment:     Possible plantar fasciitis versus cyst or plantar fibroma formation    Plan:     H&P and x-ray reviewed. At this time I did a careful steroid injection around the area to reduce inflammation and then discussed the possibility for excision if it remains or if it should grow in size or change color. I'm assuming it will resolve and patient will let us know if any issues occur

## 2015-02-12 DIAGNOSIS — E0500 Thyrotoxicosis with diffuse goiter without thyrotoxic crisis or storm: Secondary | ICD-10-CM

## 2015-02-12 HISTORY — DX: Thyrotoxicosis with diffuse goiter without thyrotoxic crisis or storm: E05.00

## 2015-02-13 ENCOUNTER — Telehealth: Payer: Self-pay | Admitting: Gastroenterology

## 2015-02-13 NOTE — Telephone Encounter (Signed)
Returned pts call.  She had eaten breakfast this morning and also popcorn this afternoon.  I explained to her that she should only have clear liquids today and to not eat anything else.  She verbalized understanding.  Will plan to proceed with tomorrows procedure.

## 2015-02-14 ENCOUNTER — Other Ambulatory Visit: Payer: Self-pay

## 2015-02-14 ENCOUNTER — Ambulatory Visit (AMBULATORY_SURGERY_CENTER): Admitting: Gastroenterology

## 2015-02-14 ENCOUNTER — Encounter: Payer: Self-pay | Admitting: Gastroenterology

## 2015-02-14 VITALS — BP 129/76 | HR 61 | Temp 95.2°F | Resp 20 | Ht 65.5 in | Wt 145.0 lb

## 2015-02-14 DIAGNOSIS — Z86010 Personal history of colonic polyps: Secondary | ICD-10-CM

## 2015-02-14 DIAGNOSIS — K648 Other hemorrhoids: Secondary | ICD-10-CM

## 2015-02-14 DIAGNOSIS — K5730 Diverticulosis of large intestine without perforation or abscess without bleeding: Secondary | ICD-10-CM

## 2015-02-14 MED ORDER — SODIUM CHLORIDE 0.9 % IV SOLN: 500.0000 mL | INTRAVENOUS | Status: DC

## 2015-02-14 NOTE — Patient Instructions (Signed)
Discharge instructions given. Handouts on diverticulosis and hemorrhoids. Resume previous medications. YOU HAD AN ENDOSCOPIC PROCEDURE TODAY AT THE [facility]:   Refer to the procedure report that was given to you for any specific questions about what was found during the examination.  If the procedure report does not answer your questions, please call your gastroenterologist to clarify.  If you requested that your care partner not be given the details of your procedure findings, then the procedure report has been included in a sealed envelope for you to review at your convenience later.  YOU SHOULD EXPECT: Some feelings of bloating in the abdomen. Passage of more gas than usual.  Walking can help get rid of the air that was put into your GI tract during the procedure and reduce the bloating. If you had a lower endoscopy (such as a colonoscopy or flexible sigmoidoscopy) you may notice spotting of blood in your stool or on the toilet paper. If you underwent a bowel prep for your procedure, you may not have a normal bowel movement for a few days.  Please Note:  You might notice some irritation and congestion in your nose or some drainage.  This is from the oxygen used during your procedure.  There is no need for concern and it should clear up in a day or so.  SYMPTOMS TO REPORT IMMEDIATELY:   Following lower endoscopy (colonoscopy or flexible sigmoidoscopy):  Excessive amounts of blood in the stool  Significant tenderness or worsening of abdominal pains  Swelling of the abdomen that is new, acute  Fever of 100F or higher   For urgent or emergent issues, a gastroenterologist can be reached at any hour by calling [phone].   DIET: Your first meal following the procedure should be a small meal and then it is ok to progress to your normal diet. Heavy or fried foods are harder to digest and may make you feel nauseous or bloated.  Likewise, meals heavy in dairy and vegetables can  increase bloating.  Drink plenty of fluids but you should avoid alcoholic beverages for 24 hours.  ACTIVITY:  You should plan to take it easy for the rest of today and you should NOT DRIVE or use heavy machinery until tomorrow (because of the sedation medicines used during the test).    FOLLOW UP: Our staff will call the number listed on your records the next business day following your procedure to check on you and address any questions or concerns that you may have regarding the information given to you following your procedure. If we do not reach you, we will leave a message.  However, if you are feeling well and you are not experiencing any problems, there is no need to return our call.  We will assume that you have returned to your regular daily activities without incident.  If any biopsies were taken you will be contacted by phone or by letter within the next 1-3 weeks.  Please call us at [phone] if you have not heard about the biopsies in 3 weeks.    SIGNATURES/CONFIDENTIALITY: You and/or your care partner have signed paperwork which will be entered into your electronic medical record.  These signatures attest to the fact that that the information above on your After Visit Summary has been reviewed and is understood.  Full responsibility of the confidentiality of this discharge information lies with you and/or your care-partner. 

## 2015-02-14 NOTE — Op Note (Signed)
[name] [place], [address]   COLONOSCOPY PROCEDURE REPORT  PATIENT: Brenda, Reyes  MR#: [id number] BIRTHDATE: 1956/09/19 , 69  yrs. old GENDER: female ENDOSCOPIST: Inda Castle, MD REFERRED XH:BZJIR Criss Rosales, M.D. PROCEDURE DATE:  02/14/2015 PROCEDURE:   Colonoscopy, surveillance First Screening Colonoscopy - Avg.  risk and is 50 yrs.  old or older - No.  Prior Negative Screening - Now for repeat screening. N/A  History of Adenoma - Now for follow-up colonoscopy & has been > or = to 3 yrs.  Yes hx of adenoma.  Has been 3 or more years since last colonoscopy. ASA CLASS:   Class II INDICATIONS:PH Colon Adenoma.2011 MEDICATIONS: Monitored anesthesia care, Propofol 250 mg IV, and Lidocaine 40 mg IV  DESCRIPTION OF PROCEDURE:   After the risks benefits and alternatives of the procedure were thoroughly explained, informed consent was obtained.  The digital rectal exam revealed no abnormalities of the rectum.   The [id number]  endoscope was introduced through the anus and advanced to the ileum. No adverse events experienced.   The quality of the prep was (Suprep was used) excellent.  The instrument was then slowly withdrawn as the colon was fully examined.      COLON FINDINGS: There was mild diverticulosis noted in the sigmoid colon.   Internal hemorrhoids were found.  Retroflexed views revealed no abnormalities. The time to cecum = 3.9 Withdrawal time = 6.6   The scope was withdrawn and the procedure completed. COMPLICATIONS: There were no immediate complications.  ENDOSCOPIC IMPRESSION: 1.   Mild diverticulosis was noted in the sigmoid colon 2.   Internal hemorrhoids  RECOMMENDATIONS: Colonoscopy 10 years  eSigned:  Inda Castle, MD 02/14/2015 9:31 AM   cc:

## 2015-02-14 NOTE — Progress Notes (Signed)
Stable to RR 

## 2015-02-15 ENCOUNTER — Telehealth: Payer: Self-pay | Admitting: *Deleted

## 2015-02-15 NOTE — Telephone Encounter (Signed)
  Follow up Call-  Call back number 02/14/2015  Post procedure Call Back phone  # [phone]  Permission to leave phone message Yes     Patient questions:  Do you have a fever, pain , or abdominal swelling? No. Pain Score  0 *  Have you tolerated food without any problems? Yes.    Have you been able to return to your normal activities? Yes.    Do you have any questions about your discharge instructions: Diet   No. Medications  No. Follow up visit  No.  Do you have questions or concerns about your Care? No.  Actions: * If pain score is 4 or above: No action needed, pain <4.

## 2015-02-20 ENCOUNTER — Emergency Department (INDEPENDENT_AMBULATORY_CARE_PROVIDER_SITE_OTHER)

## 2015-02-20 ENCOUNTER — Emergency Department (INDEPENDENT_AMBULATORY_CARE_PROVIDER_SITE_OTHER)
Admission: EM | Admit: 2015-02-20 | Discharge: 2015-02-20 | Disposition: A | Discharge status: Home / Self Care | Source: Home / Self Care | Attending: Family Medicine | Admitting: Family Medicine

## 2015-02-20 ENCOUNTER — Encounter (HOSPITAL_COMMUNITY): Payer: Self-pay | Admitting: Emergency Medicine

## 2015-02-20 DIAGNOSIS — W19XXXA Unspecified fall, initial encounter: Secondary | ICD-10-CM | POA: Diagnosis not present

## 2015-02-20 DIAGNOSIS — S7012XA Contusion of left thigh, initial encounter: Secondary | ICD-10-CM
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
## 2015-02-20 NOTE — Discharge Instructions (Signed)
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
## 2015-02-26 ENCOUNTER — Telehealth: Payer: Self-pay | Admitting: Gastroenterology

## 2015-02-27 ENCOUNTER — Other Ambulatory Visit: Payer: Self-pay | Admitting: Internal Medicine

## 2015-02-27 DIAGNOSIS — E0590 Thyrotoxicosis, unspecified without thyrotoxic crisis or storm: Secondary | ICD-10-CM

## 2015-02-27 NOTE — Telephone Encounter (Signed)
Explained banding to the patient.

## 2015-03-01 ENCOUNTER — Ambulatory Visit (HOSPITAL_COMMUNITY)
Admission: RE | Admit: 2015-03-01 | Discharge: 2015-03-01 | Disposition: A | Discharge status: Home / Self Care | Source: Ambulatory Visit | Attending: Internal Medicine | Admitting: Internal Medicine

## 2015-03-01 DIAGNOSIS — E0590 Thyrotoxicosis, unspecified without thyrotoxic crisis or storm: Secondary | ICD-10-CM | POA: Insufficient documentation

## 2015-03-01 LAB — HCG, SERUM, QUALITATIVE: PREG SERUM: NEGATIVE

## 2015-03-01 MED ORDER — SODIUM IODIDE I 131 CAPSULE
18.5000 | Freq: Once | INTRAVENOUS | Status: AC | PRN
  Administered 2015-03-01: 18.5 via ORAL

## 2015-03-08 ENCOUNTER — Encounter: Payer: Self-pay | Admitting: Podiatry

## 2015-03-08 ENCOUNTER — Ambulatory Visit (INDEPENDENT_AMBULATORY_CARE_PROVIDER_SITE_OTHER): Admitting: Podiatry

## 2015-03-08 VITALS — BP 130/84 | HR 70 | Resp 12

## 2015-03-08 DIAGNOSIS — M722 Plantar fascial fibromatosis: Secondary | ICD-10-CM | POA: Diagnosis not present

## 2015-03-10 NOTE — Progress Notes (Signed)
Subjective:     Patient ID: Brenda Reyes, female   DOB: [birth year], 57 y.o.   MRN: [id number]  HPI patient states my left heel is doing much better and I know long-term I need support   Review of Systems     Objective:   Physical Exam Neurovascular status intact no change in health history with discomfort plantar heel left still present but improved    Assessment:     Plantar fasciitis still present but improving left    Plan:     Advised on physical therapy anti-inflammatory shoe gear modifications and long-term orthotics. She will have these made at her convenience

## 2015-04-16 ENCOUNTER — Ambulatory Visit (INDEPENDENT_AMBULATORY_CARE_PROVIDER_SITE_OTHER): Admitting: Gastroenterology

## 2015-04-16 ENCOUNTER — Encounter: Payer: Self-pay | Admitting: Gastroenterology

## 2015-04-16 VITALS — BP 108/74 | HR 64 | Ht 65.5 in | Wt 146.2 lb

## 2015-04-16 DIAGNOSIS — K641 Second degree hemorrhoids: Secondary | ICD-10-CM | POA: Diagnosis not present

## 2015-04-16 NOTE — Progress Notes (Signed)
PROCEDURE NOTE: The patient presents with symptomatic grade **2*  hemorrhoids, requesting rubber band ligation of his/her hemorrhoidal disease.  All risks, benefits and alternative forms of therapy were described and informed consent was obtained.   The anorectum was pre-medicated with lubricant and nitroglycerine ointment The decision was made to band the *right posterior** internal hemorrhoid, and the [place] was used to perform band ligation without complication.  Digital anorectal examination was then performed to assure proper positioning of the band, and to adjust the banded tissue as required.  The patient was discharged home without pain or other issues.  Dietary and behavioral recommendations were given and along with follow-up instructions.    The patient will return in *4** weeks for  follow-up and possible additional banding as required. No complications were encountered and the patient tolerated the procedure well.

## 2015-04-16 NOTE — Patient Instructions (Addendum)
You have been given a separate informational sheet regarding your tobacco use, the importance of quitting and local resources to help you quit. HEMORRHOID BANDING PROCEDURE    FOLLOW-UP CARE   1. The procedure you have had should have been relatively painless since the banding of the area involved does not have nerve endings and there is no pain sensation.  The rubber band cuts off the blood supply to the hemorrhoid and the band may fall off as soon as 48 hours after the banding (the band may occasionally be seen in the toilet bowl following a bowel movement). You may notice a temporary feeling of fullness in the rectum which should respond adequately to plain Tylenol or Motrin.  2. Following the banding, avoid strenuous exercise that evening and resume full activity the next day.  A sitz bath (soaking in a warm tub) or bidet is soothing, and can be useful for cleansing the area after bowel movements.     3. To avoid constipation, take two tablespoons of natural wheat bran, natural oat bran, flax, Benefiber or any over the counter fiber supplement and increase your water intake to 7-8 glasses daily.    4. Unless you have been prescribed anorectal medication, do not put anything inside your rectum for two weeks: No suppositories, enemas, fingers, etc.  5. Occasionally, you may have more bleeding than usual after the banding procedure.  This is often from the untreated hemorrhoids rather than the treated one.  Don't be concerned if there is a tablespoon or so of blood.  If there is more blood than this, lie flat with your bottom higher than your head and apply an ice pack to the area. If the bleeding does not stop within a half an hour or if you feel faint, call our office at [phone] or go to the emergency room.  6. Problems are not common; however, if there is a substantial amount of bleeding, severe pain, chills, fever or difficulty passing urine (very rare) or other problems, you should  call us at [phone] or report to the nearest emergency room.  7. Do not stay seated continuously for more than 2-3 hours for a day or two after the procedure.  Tighten your buttock muscles 10-15 times every two hours and take 10-15 deep breaths every 1-2 hours.  Do not spend more than a few minutes on the toilet if you cannot empty your bowel; instead re-visit the toilet at a later time.   Your follow up for your 2nd banding is scheduled on 06/19/2015 at 10:45am

## 2015-06-19 ENCOUNTER — Encounter: Payer: Self-pay | Admitting: Gastroenterology

## 2015-06-19 ENCOUNTER — Ambulatory Visit (INDEPENDENT_AMBULATORY_CARE_PROVIDER_SITE_OTHER): Admitting: Gastroenterology

## 2015-06-19 VITALS — BP 112/70 | HR 76 | Ht 65.5 in | Wt 155.4 lb

## 2015-06-19 DIAGNOSIS — K641 Second degree hemorrhoids: Secondary | ICD-10-CM | POA: Diagnosis not present

## 2015-06-19 NOTE — Patient Instructions (Signed)
HEMORRHOID BANDING PROCEDURE    FOLLOW-UP CARE   1. The procedure you have had should have been relatively painless since the banding of the area involved does not have nerve endings and there is no pain sensation.  The rubber band cuts off the blood supply to the hemorrhoid and the band may fall off as soon as 48 hours after the banding (the band may occasionally be seen in the toilet bowl following a bowel movement). You may notice a temporary feeling of fullness in the rectum which should respond adequately to plain Tylenol or Motrin.  2. Following the banding, avoid strenuous exercise that evening and resume full activity the next day.  A sitz bath (soaking in a warm tub) or bidet is soothing, and can be useful for cleansing the area after bowel movements.     3. To avoid constipation, take two tablespoons of natural wheat bran, natural oat bran, flax, Benefiber or any over the counter fiber supplement and increase your water intake to 7-8 glasses daily.    4. Unless you have been prescribed anorectal medication, do not put anything inside your rectum for two weeks: No suppositories, enemas, fingers, etc.  5. Occasionally, you may have more bleeding than usual after the banding procedure.  This is often from the untreated hemorrhoids rather than the treated one.  Don't be concerned if there is a tablespoon or so of blood.  If there is more blood than this, lie flat with your bottom higher than your head and apply an ice pack to the area. If the bleeding does not stop within a half an hour or if you feel faint, call our office at [phone] or go to the emergency room.  6. Problems are not common; however, if there is a substantial amount of bleeding, severe pain, chills, fever or difficulty passing urine (very rare) or other problems, you should call us at [phone] or report to the nearest emergency room.  7. Do not stay seated continuously for more than 2-3 hours for a day or two  after the procedure.  Tighten your buttock muscles 10-15 times every two hours and take 10-15 deep breaths every 1-2 hours.  Do not spend more than a few minutes on the toilet if you cannot empty your bowel; instead re-visit the toilet at a later time.  Your 3rd banding is scheduled on 08/24/2015 at 10:45am

## 2015-06-19 NOTE — Progress Notes (Signed)
PROCEDURE NOTE: The patient presents with symptomatic grade *2**  hemorrhoids, requesting rubber band ligation of his/her hemorrhoidal disease.  All risks, benefits and alternative forms of therapy were described and informed consent was obtained.   The anorectum was pre-medicated with lubricant and nitroglycerine ointment The decision was made to band the **right anterior* internal hemorrhoid, and the [place] was used to perform band ligation without complication.  Digital anorectal examination was then performed to assure proper positioning of the band, and to adjust the banded tissue as required.  The patient was discharged home without pain or other issues.  Dietary and behavioral recommendations were given and along with follow-up instructions.    The patient will return in *4** weeks for  follow-up and possible additional banding as required. No complications were encountered and the patient tolerated the procedure well.

## 2015-07-23 ENCOUNTER — Other Ambulatory Visit (HOSPITAL_COMMUNITY)
Admission: RE | Admit: 2015-07-23 | Discharge: 2015-07-23 | Disposition: A | Discharge status: Home / Self Care | Source: Ambulatory Visit | Attending: Family Medicine | Admitting: Family Medicine

## 2015-07-23 ENCOUNTER — Emergency Department (INDEPENDENT_AMBULATORY_CARE_PROVIDER_SITE_OTHER)
Admission: EM | Admit: 2015-07-23 | Discharge: 2015-07-23 | Disposition: A | Discharge status: Home / Self Care | Source: Home / Self Care | Attending: Family Medicine | Admitting: Family Medicine

## 2015-07-23 ENCOUNTER — Encounter (HOSPITAL_COMMUNITY): Payer: Self-pay | Admitting: Emergency Medicine

## 2015-07-23 DIAGNOSIS — N760 Acute vaginitis: Secondary | ICD-10-CM | POA: Insufficient documentation

## 2015-07-23 DIAGNOSIS — N898 Other specified noninflammatory disorders of vagina: Secondary | ICD-10-CM

## 2015-07-23 DIAGNOSIS — Z113 Encounter for screening for infections with a predominantly sexual mode of transmission: Secondary | ICD-10-CM | POA: Diagnosis not present

## 2015-07-23 MED ORDER — METRONIDAZOLE 500 MG PO TABS: 500.0000 mg | ORAL_TABLET | Freq: Two times a day (BID) | ORAL | Status: DC

## 2015-07-23 NOTE — ED Notes (Signed)
C/o vaginal discharge States she has a fishy odor States she has a heavy discharge No tx tried

## 2015-07-23 NOTE — Discharge Instructions (Signed)
Please start the metronidazole for BV.  We will call you if your other results show additional infections

## 2015-07-23 NOTE — ED Provider Notes (Signed)
CSN: [id number]     Arrival date & time 07/23/15  55 History   First MD Initiated Contact with Patient 07/23/15 1336     Chief Complaint  Patient presents with  . Vaginal Discharge   (Consider location/radiation/quality/duration/timing/severity/associated sxs/prior Treatment) HPI   5-83mo of vaginal discharge. Started getting worse over the last week. Now w/ foul smell. Vaginal area feels swollen. Last intercourse 5-6 mo ago w/o condom. Problem is intermittent. Has not tried anything to relieve her symptoms. Nothing makes her symptoms worse.  Denies low back pain, dysuria, frequency, back pain, nausea, vomiting, rash, diarrhea, constipation.     Past Medical History  Diagnosis Date  . Hypertension   . Colon polyps 12/27/2009    Tubular adenomatous polyps  . Internal hemorrhoids   . Borderline diabetes   . Graves disease 02/12/15    to start treatment after colonoscopiy  . Hyperthyroidism    Past Surgical History  Procedure Laterality Date  . Abdominal hysterectomy    . Tubal ligation    . Hemorrhoid surgery    . Radioactive tyhroid iodine     Family History  Problem Relation Age of Onset  . Colon cancer Neg Hx   . Stomach cancer Neg Hx   . Throat cancer Father   . Cancer - Other Mother     blood   Social History  Substance Use Topics  . Smoking status: Current Some Day Smoker -- 0.25 packs/day    Types: Cigarettes  . Smokeless tobacco: Never Used     Comment: no cigarattes in 7 days per pt.   . Alcohol Use: 0.0 oz/week    0 Standard drinks or equivalent per week     Comment: not drinking per patient, none in "7 days", in "AA" per pt.   OB History    No data available     Review of Systems Per HPI with all other pertinent systems negative.   Allergies  Triamterene  Home Medications   Prior to Admission medications   Medication Sig Start Date End Date Taking? Authorizing Provider  amLODipine (NORVASC) 10 MG tablet Take 10 mg by mouth daily. 11/29/12    Clanford Marisa Hua, MD  Calcium Carbonate-Vitamin D (CALTRATE 600+D) 600-400 MG-UNIT per tablet Take 1 tablet by mouth daily.    Historical Provider, MD  cloNIDine (CATAPRES) 0.2 MG tablet Take 0.2 mg by mouth at bedtime. 09/15/12   Rosana Hoes, MD  cyclobenzaprine (FLEXERIL) 5 MG tablet Take 5 mg by mouth daily as needed for muscle spasms.    Historical Provider, MD  lisinopril-hydrochlorothiazide (PRINZIDE,ZESTORETIC) 20-12.5 MG per tablet Take 2 tablets by mouth daily. 11/29/12   Clanford Marisa Hua, MD  loratadine (CLARITIN) 10 MG tablet Take 10 mg by mouth daily. 09/15/12   Rosana Hoes, MD  meloxicam (MOBIC) 15 MG tablet Take 15 mg by mouth daily as needed for pain.    Historical Provider, MD  metoprolol (TOPROL-XL) 200 MG 24 hr tablet Take 200 mg by mouth daily.    Historical Provider, MD  metroNIDAZOLE (FLAGYL) 500 MG tablet Take 1 tablet (500 mg total) by mouth 2 (two) times daily. 07/23/15   Waldemar Dickens, MD  potassium chloride (K-DUR) 10 MEQ tablet Take 10 mEq by mouth once.  01/14/13   Costin Karlyne Greenspan, MD  Prenatal Vit w/ Fe Bisg-FA 25-1 MG TABS Take 1 tablet by mouth daily. 01/14/13   Costin Karlyne Greenspan, MD  thiamine (VITAMIN B-1) 100 MG tablet Take 100 mg by mouth  daily. 11/29/12   Clanford Marisa Hua, MD   Meds Ordered and Administered this Visit  Medications - No data to display  BP 126/85 mmHg  Pulse 65  Temp(Src) 97.9 F (36.6 C) (Oral)  Resp 16  SpO2 97% No data found.   Physical Exam Physical Exam  Constitutional: oriented to person, place, and time. appears well-developed and well-nourished. No distress.  HENT:  Head: Normocephalic and atraumatic.  Eyes: EOMI. PERRL.  Neck: Normal range of motion.  Cardiovascular: RRR, no m/r/g, 2+ distal pulses,  Pulmonary/Chest: Effort normal and breath sounds normal. No respiratory distress.  Abdominal: Soft. Bowel sounds are normal. NonTTP, no distension.  Musculoskeletal: Normal range of motion. Non ttp, no effusion.  Neurological:  alert and oriented to person, place, and time.  Skin: Skin is warm. No rash noted. non diaphoretic.  Psychiatric: normal mood and affect. behavior is normal. Judgment and thought content normal.  GU: Vaginal walls well rugated, labial folds normal, cervix only partially present as there is a lateral scar on the right noted from previous hysterectomy. No cervical motion tenderness. ED Course  Procedures (including critical care time)  Labs Review Labs Reviewed - No data to display  Imaging Review No results found.   Visual Acuity Review  Right Eye Distance:   Left Eye Distance:   Bilateral Distance:    Right Eye Near:   Left Eye Near:    Bilateral Near:         MDM   1. Vaginal discharge    Likely BV but cannot rule out STD. STD panel sent. Start metronidazole.    Waldemar Dickens, MD 07/23/15 [phone]

## 2015-07-24 LAB — CERVICOVAGINAL ANCILLARY ONLY
CHLAMYDIA, DNA PROBE: NEGATIVE
NEISSERIA GONORRHEA: NEGATIVE
Wet Prep (BD Affirm): POSITIVE — AB

## 2015-07-26 NOTE — ED Notes (Signed)
Final report of vaginal swab testing positive for gardnerella, treatment adequate w Flagyl Rx. Called and discussed findings with patient

## 2015-08-24 ENCOUNTER — Encounter: Admitting: Gastroenterology

## 2015-10-30 ENCOUNTER — Emergency Department (HOSPITAL_COMMUNITY): Admission: EM | Admit: 2015-10-30 | Discharge: 2015-10-31 | Disposition: A | Discharge status: Home / Self Care

## 2015-10-31 NOTE — ED Notes (Signed)
Pt stopped burning as bad and wanted to leave. Did not finish triaging this patient.

## 2015-11-06 ENCOUNTER — Telehealth: Payer: Self-pay | Admitting: Gastroenterology

## 2015-11-06 NOTE — Telephone Encounter (Signed)
No answer/machine I will continue to try and reach the patient  

## 2015-11-07 ENCOUNTER — Other Ambulatory Visit (HOSPITAL_COMMUNITY)
Admission: RE | Admit: 2015-11-07 | Discharge: 2015-11-07 | Disposition: A | Discharge status: Home / Self Care | Source: Ambulatory Visit | Attending: Family Medicine | Admitting: Family Medicine

## 2015-11-07 ENCOUNTER — Other Ambulatory Visit: Payer: Self-pay | Admitting: Family Medicine

## 2015-11-07 DIAGNOSIS — Z01419 Encounter for gynecological examination (general) (routine) without abnormal findings: Secondary | ICD-10-CM | POA: Insufficient documentation

## 2015-11-07 DIAGNOSIS — Z1151 Encounter for screening for human papillomavirus (HPV): Secondary | ICD-10-CM | POA: Insufficient documentation

## 2015-11-08 NOTE — Telephone Encounter (Signed)
No answer and no voicemail. We will attempt to reach patient one more time.

## 2015-11-09 LAB — CYTOLOGY - PAP

## 2015-11-13 NOTE — Telephone Encounter (Signed)
Spoke with patient and she is ok with her appointment now.

## 2015-11-13 NOTE — Telephone Encounter (Signed)
Left a message for patient to call back. 

## 2015-11-27 ENCOUNTER — Ambulatory Visit: Admitting: Gastroenterology

## 2015-11-29 ENCOUNTER — Ambulatory Visit: Admitting: Gastroenterology

## 2016-09-03 ENCOUNTER — Encounter (HOSPITAL_COMMUNITY): Payer: Self-pay | Admitting: *Deleted

## 2016-09-03 ENCOUNTER — Ambulatory Visit (HOSPITAL_COMMUNITY)
Admission: EM | Admit: 2016-09-03 | Discharge: 2016-09-03 | Disposition: A | Discharge status: Home / Self Care | Attending: Physician Assistant | Admitting: Physician Assistant

## 2016-09-03 DIAGNOSIS — R300 Dysuria: Secondary | ICD-10-CM

## 2016-09-03 DIAGNOSIS — Z711 Person with feared health complaint in whom no diagnosis is made: Secondary | ICD-10-CM

## 2016-09-03 LAB — POCT URINALYSIS DIP (DEVICE)
BILIRUBIN URINE: NEGATIVE
Glucose, UA: NEGATIVE mg/dL
HGB URINE DIPSTICK: NEGATIVE
KETONES UR: NEGATIVE mg/dL
Nitrite: NEGATIVE
PH: 7.5 (ref 5.0–8.0)
PROTEIN: NEGATIVE mg/dL
Specific Gravity, Urine: 1.01 (ref 1.005–1.030)
Urobilinogen, UA: 0.2 mg/dL (ref 0.0–1.0)

## 2016-09-03 NOTE — ED Triage Notes (Signed)
Pt  Reports     Symptoms  Pf  Urinary  Frequency      Pressure      X   6  Months        Pt  Noticed   Some  Blood  In  Her urine        As   Well     She  Reports   Symptoms  Flared  Up over  The  Weekend

## 2016-09-03 NOTE — ED Provider Notes (Signed)
CSN: [id number]     Arrival date & time 09/03/16  1000 History   First MD Initiated Contact with Patient 09/03/16 1022     Chief Complaint  Patient presents with  . Urinary Frequency   (Consider location/radiation/quality/duration/timing/severity/associated sxs/prior Treatment) HPI 60 y/o female with multiple complaints. States that she is using the bathroom frequently and is concerned that her kidneys are getting bad due to the amount of medication she is taking. Also would like to cut back on the amount of medicine she is taking. Also having back pain..  Past Medical History:  Diagnosis Date  . Borderline diabetes   . Colon polyps 12/27/2009   Tubular adenomatous polyps  . Graves disease 02/12/15   to start treatment after colonoscopiy  . Hypertension   . Hyperthyroidism   . Internal hemorrhoids    Past Surgical History:  Procedure Laterality Date  . ABDOMINAL HYSTERECTOMY    . HEMORRHOID SURGERY    . RADIOACTIVE TYHROID IODINE    . TUBAL LIGATION     Family History  Problem Relation Age of Onset  . Throat cancer Father   . Cancer - Other Mother     blood  . Colon cancer Neg Hx   . Stomach cancer Neg Hx    Social History  Substance Use Topics  . Smoking status: Current Some Day Smoker    Packs/day: 0.25    Types: Cigarettes  . Smokeless tobacco: Never Used     Comment: no cigarattes in 7 days per pt.   . Alcohol use 0.0 oz/week     Comment: not drinking per patient, none in "7 days", in "AA" per pt.   OB History    No data available     Review of Systems  Denies: HEADACHE, NAUSEA, ABDOMINAL PAIN, CHEST PAIN, CONGESTION, SHORTNESS OF BREATH  Allergies  Triamterene  Home Medications   Prior to Admission medications   Medication Sig Start Date End Date Taking? Authorizing Provider  amLODipine (NORVASC) 10 MG tablet Take 10 mg by mouth daily. 11/29/12   Clanford Marisa Hua, MD  Calcium Carbonate-Vitamin D (CALTRATE 600+D) 600-400 MG-UNIT per tablet Take 1  tablet by mouth daily.    Historical Provider, MD  cloNIDine (CATAPRES) 0.2 MG tablet Take 0.2 mg by mouth at bedtime. 09/15/12   Rosana Hoes, MD  cyclobenzaprine (FLEXERIL) 5 MG tablet Take 5 mg by mouth daily as needed for muscle spasms.    Historical Provider, MD  lisinopril-hydrochlorothiazide (PRINZIDE,ZESTORETIC) 20-12.5 MG per tablet Take 2 tablets by mouth daily. 11/29/12   Clanford Marisa Hua, MD  loratadine (CLARITIN) 10 MG tablet Take 10 mg by mouth daily. 09/15/12   Rosana Hoes, MD  meloxicam (MOBIC) 15 MG tablet Take 15 mg by mouth daily as needed for pain.    Historical Provider, MD  metoprolol (TOPROL-XL) 200 MG 24 hr tablet Take 200 mg by mouth daily.    Historical Provider, MD  metroNIDAZOLE (FLAGYL) 500 MG tablet Take 1 tablet (500 mg total) by mouth 2 (two) times daily. 07/23/15   Waldemar Dickens, MD  potassium chloride (K-DUR) 10 MEQ tablet Take 10 mEq by mouth once.  01/14/13   Costin Karlyne Greenspan, MD  Prenatal Vit w/ Fe Bisg-FA 25-1 MG TABS Take 1 tablet by mouth daily. 01/14/13   Costin Karlyne Greenspan, MD  thiamine (VITAMIN B-1) 100 MG tablet Take 100 mg by mouth daily. 11/29/12   Clanford Marisa Hua, MD   Meds Ordered and Administered this Visit  Medications -  No data to display  BP 130/72 (BP Location: Right Arm)   Pulse 78   Temp 98.6 F (37 C) (Oral)   Resp 18   SpO2 99%  No data found.   Physical Exam NURSES NOTES AND VITAL SIGNS REVIEWED. CONSTITUTIONAL: Well developed, well nourished, no acute distress HEENT: normocephalic, atraumatic EYES: Conjunctiva normal NECK:normal ROM, supple, no adenopathy PULMONARY:No respiratory distress, normal effort ABDOMINAL: Soft, ND, NT BS+, No CVAT MUSCULOSKELETAL: Normal ROM of all extremities,  SKIN: warm and dry without rash PSYCHIATRIC: Mood and affect, behavior are normal  Urgent Care Course   Clinical Course  pt would  Procedures (including critical care time)  Labs Review Labs Reviewed - No data to display  Imaging  Review No results found.   Visual Acuity Review  Right Eye Distance:   Left Eye Distance:   Bilateral Distance:    Right Eye Near:   Left Eye Near:    Bilateral Near:      DISCUSSED WITH DR. BLAND. SHE GAVE ME THE LATEST RENAL TEST RESULTS AND PT IS DOING VERY WELL. DR. Criss Rosales THANKS FOR CALLING, BUT WOULD LIKE TO REASSURE PATIENT THAT ALL IS WELL AT THIS TIME.    MDM   1. Physically well but worried     Patient is reassured that there are no issues that require transfer to higher level of care at this time or additional tests. Patient is advised to continue home symptomatic treatment. Patient is advised that if there are new or worsening symptoms to attend the emergency department, contact primary care provider, or return to UC. Instructions of care provided discharged home in stable condition.    THIS NOTE WAS GENERATED USING A VOICE RECOGNITION SOFTWARE PROGRAM. ALL REASONABLE EFFORTS  WERE MADE TO PROOFREAD THIS DOCUMENT FOR ACCURACY.  I have verbally reviewed the discharge instructions with the patient. A printed AVS was given to the patient.  All questions were answered prior to discharge. \    Konrad Felix, PA 09/03/16 1410

## 2016-09-03 NOTE — Discharge Instructions (Signed)
YOU SHOULD CONTINUE TO TAKE THE MEDICATIONS AS DR. BLAND HAS DIRECTED.  YOUR KIDNEYS ARE WORKING FINE, AND HAVE NO SIGNS OF DISEASE  YOU WILL DO WELL IF YOU CONTINUE DR. BLAND'S PLAN FOR YOU  CONTINUE TO EAT WELL AND EXERCISE.  DO NOT SMOKE OR DRINK ALCOHOL

## 2017-03-21 IMAGING — NM NM RAI THERAPY FOR HYPERTHYROIDISM
1 series · 1 of 1 positions shown · non-contrast
Comparison: None.

CLINICAL DATA: Hyperthyroidism

EXAM:
RADIOACTIVE IODINE THERAPY FOR HYPERTHYROIDISM
TECHNIQUE: Radioactive iodine prescribed by Dr. Browder. The risks and
benefits of radioactive iodine therapy were discussed with the
patient in detail by Dr. Nomasibulele. Alternative therapies were also
mentioned. Radiation safety was discussed with the patient,
including how to protect the general public from exposure. There
were no barriers to communication. Written consent was obtained. The
patient then received a capsule containing the radiopharmaceutical.
The patient will follow-up with the referring physician.
RADIOPHARMACEUTICALS:  18.5 mCi Q-V6V sodium iodide.

[st static image · 1 of 1 slices shown]
[im 1/1]
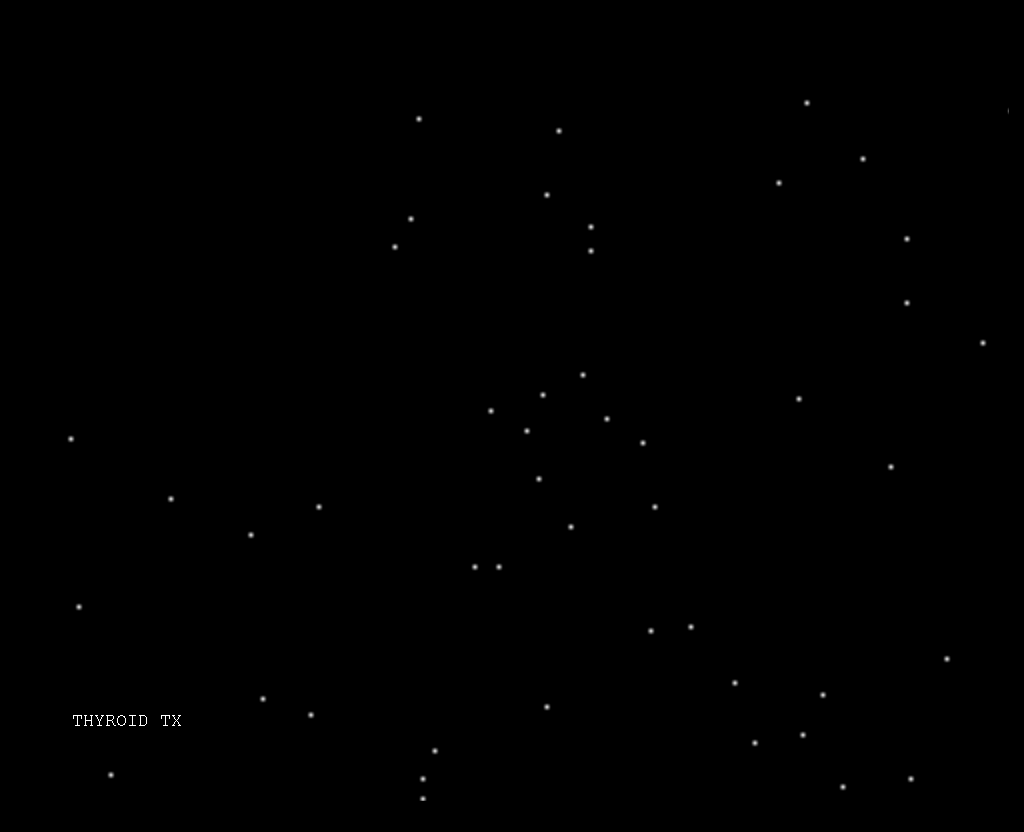

[1 of 1 positions shown; findings below may reference images not displayed]

IMPRESSION: Per oral administration of Q-V6V sodium iodide for the treatment of
hyperthyroidism.

## 2017-05-10 DIAGNOSIS — K56609 Unspecified intestinal obstruction, unspecified as to partial versus complete obstruction: Secondary | ICD-10-CM

## 2017-05-10 HISTORY — DX: Unspecified intestinal obstruction, unspecified as to partial versus complete obstruction: K56.609

## 2017-05-18 ENCOUNTER — Inpatient Hospital Stay (HOSPITAL_COMMUNITY)
Admission: EM | Admit: 2017-05-18 | Discharge: 2017-05-21 | DRG: 390 | Disposition: A | Discharge status: Home / Self Care | Attending: General Surgery | Admitting: General Surgery

## 2017-05-18 ENCOUNTER — Inpatient Hospital Stay (HOSPITAL_COMMUNITY)

## 2017-05-18 ENCOUNTER — Encounter (HOSPITAL_COMMUNITY): Payer: Self-pay

## 2017-05-18 ENCOUNTER — Emergency Department (HOSPITAL_COMMUNITY)

## 2017-05-18 DIAGNOSIS — I10 Essential (primary) hypertension: Secondary | ICD-10-CM | POA: Diagnosis present

## 2017-05-18 DIAGNOSIS — E890 Postprocedural hypothyroidism: Secondary | ICD-10-CM | POA: Diagnosis present

## 2017-05-18 DIAGNOSIS — Z0189 Encounter for other specified special examinations: Secondary | ICD-10-CM

## 2017-05-18 DIAGNOSIS — K56609 Unspecified intestinal obstruction, unspecified as to partial versus complete obstruction: Secondary | ICD-10-CM | POA: Diagnosis present

## 2017-05-18 DIAGNOSIS — Z86010 Personal history of colonic polyps: Secondary | ICD-10-CM | POA: Diagnosis not present

## 2017-05-18 DIAGNOSIS — Z79899 Other long term (current) drug therapy: Secondary | ICD-10-CM

## 2017-05-18 DIAGNOSIS — F17210 Nicotine dependence, cigarettes, uncomplicated: Secondary | ICD-10-CM | POA: Diagnosis present

## 2017-05-18 DIAGNOSIS — E876 Hypokalemia: Secondary | ICD-10-CM | POA: Diagnosis present

## 2017-05-18 DIAGNOSIS — Z808 Family history of malignant neoplasm of other organs or systems: Secondary | ICD-10-CM

## 2017-05-18 DIAGNOSIS — R109 Unspecified abdominal pain: Secondary | ICD-10-CM

## 2017-05-18 HISTORY — DX: Unspecified intestinal obstruction, unspecified as to partial versus complete obstruction: K56.609

## 2017-05-18 LAB — URINALYSIS, ROUTINE W REFLEX MICROSCOPIC
BILIRUBIN URINE: NEGATIVE
Glucose, UA: 150 mg/dL — AB
HGB URINE DIPSTICK: NEGATIVE
Ketones, ur: NEGATIVE mg/dL
Nitrite: NEGATIVE
PROTEIN: NEGATIVE mg/dL
SPECIFIC GRAVITY, URINE: 1.024 (ref 1.005–1.030)
pH: 6 (ref 5.0–8.0)

## 2017-05-18 LAB — COMPREHENSIVE METABOLIC PANEL
ALK PHOS: 53 U/L (ref 38–126)
ALT: 14 U/L (ref 14–54)
AST: 25 U/L (ref 15–41)
Albumin: 4.3 g/dL (ref 3.5–5.0)
Anion gap: 8 (ref 5–15)
BUN: 18 mg/dL (ref 6–20)
CALCIUM: 9.2 mg/dL (ref 8.9–10.3)
CO2: 25 mmol/L (ref 22–32)
CREATININE: 0.65 mg/dL (ref 0.44–1.00)
Chloride: 104 mmol/L (ref 101–111)
GFR calc Af Amer: >60 mL/min (ref >60)
GFR calc non Af Amer: >60 mL/min (ref >60)
GLUCOSE: 120 mg/dL — AB (ref 65–99)
Potassium: 3.2 mmol/L — ABNORMAL LOW (ref 3.5–5.1)
SODIUM: 137 mmol/L (ref 135–145)
Total Bilirubin: 0.6 mg/dL (ref 0.3–1.2)
Total Protein: 6.5 g/dL (ref 6.5–8.1)

## 2017-05-18 LAB — CBC
HCT: 38.1 % (ref 36.0–46.0)
Hemoglobin: 12.7 g/dL (ref 12.0–15.0)
MCH: 30.1 pg (ref 26.0–34.0)
MCHC: 33.3 g/dL (ref 30.0–36.0)
MCV: 90.3 fL (ref 78.0–100.0)
PLATELETS: 286 10*3/uL (ref 150–400)
RBC: 4.22 MIL/uL (ref 3.87–5.11)
RDW: 12.9 % (ref 11.5–15.5)
WBC: 7.1 10*3/uL (ref 4.0–10.5)

## 2017-05-18 LAB — TSH: TSH: 0.37 u[IU]/mL (ref 0.350–4.500)

## 2017-05-18 LAB — LIPASE, BLOOD: Lipase: 29 U/L (ref 11–51)

## 2017-05-18 LAB — CBG MONITORING, ED: Glucose-Capillary: 113 mg/dL — ABNORMAL HIGH (ref 65–99)

## 2017-05-18 MED ORDER — ACETAMINOPHEN 325 MG PO TABS: 650.0000 mg | ORAL_TABLET | Freq: Four times a day (QID) | ORAL | Status: DC | PRN

## 2017-05-18 MED ORDER — ONDANSETRON 4 MG PO TBDP: 4.0000 mg | ORAL_TABLET | Freq: Four times a day (QID) | ORAL | Status: DC | PRN

## 2017-05-18 MED ORDER — DIATRIZOATE MEGLUMINE & SODIUM 66-10 % PO SOLN
90.0000 mL | Freq: Once | ORAL | Status: AC
  Administered 2017-05-18: 90 mL via NASOGASTRIC
  Filled 2017-05-18: qty 90

## 2017-05-18 MED ORDER — ONDANSETRON HCL 4 MG/2ML IJ SOLN
4.0000 mg | Freq: Four times a day (QID) | INTRAMUSCULAR | Status: DC | PRN
  Administered 2017-05-18: 4 mg via INTRAVENOUS
  Filled 2017-05-18: qty 2

## 2017-05-18 MED ORDER — BISACODYL 10 MG RE SUPP: 10.0000 mg | Freq: Every day | RECTAL | Status: DC | PRN

## 2017-05-18 MED ORDER — METOPROLOL TARTRATE 5 MG/5ML IV SOLN: 5.0000 mg | Freq: Four times a day (QID) | INTRAVENOUS | Status: DC | PRN

## 2017-05-18 MED ORDER — IOPAMIDOL (ISOVUE-300) INJECTION 61%
INTRAVENOUS | Status: AC
  Administered 2017-05-18: 100 mL
  Filled 2017-05-18: qty 100

## 2017-05-18 MED ORDER — KCL IN DEXTROSE-NACL 20-5-0.9 MEQ/L-%-% IV SOLN
INTRAVENOUS | Status: DC
Start: 2017-05-18 — End: 2017-05-19
  Administered 2017-05-18: 23:00:00 via INTRAVENOUS
  Filled 2017-05-18 (×3): qty 1000

## 2017-05-18 MED ORDER — ONDANSETRON HCL 4 MG/2ML IJ SOLN
4.0000 mg | Freq: Once | INTRAMUSCULAR | Status: AC
  Administered 2017-05-18: 4 mg via INTRAVENOUS
  Filled 2017-05-18: qty 2

## 2017-05-18 MED ORDER — ACETAMINOPHEN 650 MG RE SUPP: 650.0000 mg | Freq: Four times a day (QID) | RECTAL | Status: DC | PRN

## 2017-05-18 MED ORDER — MORPHINE SULFATE (PF) 4 MG/ML IV SOLN
2.0000 mg | INTRAVENOUS | Status: DC | PRN
  Administered 2017-05-18 – 2017-05-19 (×4): 4 mg via INTRAVENOUS
  Filled 2017-05-18 (×4): qty 1

## 2017-05-18 MED ORDER — MORPHINE SULFATE (PF) 4 MG/ML IV SOLN
4.0000 mg | Freq: Once | INTRAVENOUS | Status: AC
  Administered 2017-05-18: 4 mg via INTRAVENOUS
  Filled 2017-05-18: qty 1

## 2017-05-18 MED ORDER — PANTOPRAZOLE SODIUM 40 MG IV SOLR
40.0000 mg | Freq: Every day | INTRAVENOUS | Status: DC
  Administered 2017-05-18 – 2017-05-20 (×3): 40 mg via INTRAVENOUS
  Filled 2017-05-18 (×3): qty 40

## 2017-05-18 NOTE — ED Triage Notes (Signed)
Pt endorses mid abd pain since this morning. Pt states that in 2015 she had an "intestinal blockage" pt had regular BM this morning, endorses nausea but no vomiting. VSS.

## 2017-05-18 NOTE — Progress Notes (Signed)
Xray notified of time when gastrografin was administered

## 2017-05-18 NOTE — ED Provider Notes (Signed)
[facility] DEPT Provider Note   CSN: [id number] Arrival date & time: 05/18/17  1147     History   Chief Complaint Chief Complaint  Patient presents with  . Abdominal Pain    HPI Brenda SERGENT is a 61 y.o. female.  The history is provided by the patient and medical records. No language interpreter was used.  Abdominal Pain   Associated symptoms include nausea. Pertinent negatives include vomiting.   Brenda Reyes is a 61 y.o. female  with a PMH of prior SBO, HTN, hyperthyroidism who presents to the Emergency Department complaining of Acute onset of central abdominal pain that began this morning. Patient describes pain as "labor pain". She has a history of small bowel obstruction in 2004 and states her pain today feels similar. Associated symptoms include nausea. No vomiting. She has taken no medications prior to arrival for her symptoms. No fever or chills, chest pain, shortness of breath, back pain, dysuria. She has a history of hysterectomy and tubal ligation-no other abdominal surgeries. No alleviating or aggravating factors noted as pain is just constant.  Past Medical History:  Diagnosis Date  . Borderline diabetes   . Colon polyps 12/27/2009   Tubular adenomatous polyps  . Graves disease 02/12/15   to start treatment after colonoscopiy  . Hypertension   . Hyperthyroidism   . Internal hemorrhoids     Patient Active Problem List   Diagnosis Date Noted  . SBO (small bowel obstruction) ([place]) 04/13/2013  . TOBACCO USER 11/15/2009  . URI 11/15/2009  . FIBROIDS, UTERUS 04/19/2007  . ANEMIA, IRON DEFICIENCY NOS 04/19/2007  . ABUSE, ALCOHOL, UNSPECIFIED 04/19/2007  . HYPERTENSION, ESSENTIAL NOS 04/19/2007  . Hemorrhoids 04/19/2007  . HYSTERECTOMY, HX OF 09/10/2006    Past Surgical History:  Procedure Laterality Date  . ABDOMINAL HYSTERECTOMY    . HEMORRHOID SURGERY    . RADIOACTIVE TYHROID IODINE    . TUBAL LIGATION      OB History    No data available        Home Medications    Prior to Admission medications   Medication Sig Start Date End Date Taking? Authorizing Provider  amLODipine (NORVASC) 10 MG tablet Take 10 mg by mouth daily. 11/29/12  Yes Johnson, Clanford L, MD  Calcium Carbonate-Vitamin D (CALTRATE 600+D) 600-400 MG-UNIT per tablet Take 1 tablet by mouth daily.   Yes [provider]  cloNIDine (CATAPRES) 0.2 MG tablet Take 0.2 mg by mouth at bedtime as needed (bllod pressure).  09/15/12  Yes Rosana Hoes, MD  levothyroxine (SYNTHROID, LEVOTHROID) 88 MCG tablet Take 88 mcg by mouth daily before breakfast.   Yes [provider]  lisinopril-hydrochlorothiazide (PRINZIDE,ZESTORETIC) 20-12.5 MG per tablet Take 2 tablets by mouth daily. 11/29/12  Yes Johnson, Clanford L, MD  meloxicam (MOBIC) 15 MG tablet Take 15 mg by mouth daily as needed for pain.   Yes [provider]  metoprolol (TOPROL-XL) 200 MG 24 hr tablet Take 200 mg by mouth daily.   Yes [provider]  potassium chloride (K-DUR) 10 MEQ tablet Take 10 mEq by mouth once.  01/14/13  Yes Caren Griffins, MD  thiamine (VITAMIN B-1) 100 MG tablet Take 100 mg by mouth daily. 11/29/12  Yes Murlean Iba, MD    Family History Family History  Problem Relation Age of Onset  . Throat cancer Father   . Cancer - Other Mother        blood  . Colon cancer Neg Hx   .  Stomach cancer Neg Hx     Social History Social History  Substance Use Topics  . Smoking status: Current Some Day Smoker    Packs/day: 0.25    Types: Cigarettes  . Smokeless tobacco: Never Used     Comment: no cigarattes in 7 days per pt.   . Alcohol use 0.0 oz/week     Comment: not drinking per patient, none in "7 days", in "AA" per pt.     Allergies   Triamterene   Review of Systems Review of Systems  Gastrointestinal: Positive for abdominal pain and nausea. Negative for vomiting.  All other systems reviewed and are negative.    Physical Exam Updated Vital  Signs BP 111/76   Pulse 60   Temp 97.9 F (36.6 C) (Oral)   Resp 17   Ht 5\' 6"  (1.676 m)   Wt 68 kg (150 lb)   SpO2 95%   BMI 24.21 kg/m   Physical Exam  Constitutional: She is oriented to person, place, and time. She appears well-developed and well-nourished. No distress.  HENT:  Head: Normocephalic and atraumatic.  Cardiovascular: Normal rate, regular rhythm and normal heart sounds.   No murmur heard. Pulmonary/Chest: Effort normal and breath sounds normal. No respiratory distress.  Abdominal: Soft. She exhibits no distension. There is tenderness.  Diffuse tenderness to central and lower abdomen with guarding.  Musculoskeletal: She exhibits no edema.  Neurological: She is alert and oriented to person, place, and time.  Skin: Skin is warm and dry.  Nursing note and vitals reviewed.    ED Treatments / Results  Labs (all labs ordered are listed, but only abnormal results are displayed) Labs Reviewed  COMPREHENSIVE METABOLIC PANEL - Abnormal; Notable for the following:       Result Value   Potassium 3.2 (*)    Glucose, Bld 120 (*)    All other components within normal limits  URINALYSIS, ROUTINE W REFLEX MICROSCOPIC - Abnormal; Notable for the following:    APPearance HAZY (*)    Glucose, UA 150 (*)    Leukocytes, UA SMALL (*)    Bacteria, UA RARE (*)    Squamous Epithelial / LPF 0-5 (*)    All other components within normal limits  LIPASE, BLOOD  CBC    EKG  EKG Interpretation None       Radiology Ct Abdomen Pelvis W Contrast  Result Date: 05/18/2017 CLINICAL DATA:  Right lower quadrant abdomen pain since this morning. History of bowel obstruction. EXAM: CT ABDOMEN AND PELVIS WITH CONTRAST TECHNIQUE: Multidetector CT imaging of the abdomen and pelvis was performed using the standard protocol following bolus administration of intravenous contrast. CONTRAST:  140mL ISOVUE-300 IOPAMIDOL (ISOVUE-300) INJECTION 61% COMPARISON:  August 17, 2014 FINDINGS: Lower chest:  Calcified granulomas of the right lung base are identified. Minimal dependent atelectasis of bilateral lung bases are noted. There is no focal pneumonia or pleural effusion. The heart size is normal. Hepatobiliary: No focal liver abnormality is seen. No gallstones, gallbladder wall thickening, or biliary dilatation. Pancreas: Unremarkable. No pancreatic ductal dilatation or surrounding inflammatory changes. Spleen: Normal in size without focal abnormality. Adrenals/Urinary Tract: Adrenal glands are unremarkable. Kidneys are normal, without renal calculi, focal lesion, or hydronephrosis. Bladder is unremarkable. Stomach/Bowel: There are multiple dilated small bowel loops in the mid and lower abdomen. There is transition at a segment of thick wall small bowel loop in the left upper pelvis. The ileal small bowel loops are normal. The appendix is normal. There is diverticulosis of  colon without diverticulitis. Moderate bowel content is identified in the colon. The stomach is normal. Vascular/Lymphatic: No significant vascular findings are present. No enlarged abdominal or pelvic lymph nodes. Reproductive: Patient status post prior hysterectomy. Small free fluid is identified in the pelvis. Other: No abdominal hernia is identified. Musculoskeletal: Degenerative joint changes of the spine are identified. IMPRESSION: Developing small bowel obstruction with transition at a segment of thick wall small bowel loop in the left upper pelvis. Normal appendix.  Moderate bowel content in the colon. Electronically Signed   By: Abelardo Diesel M.D.   On: 05/18/2017 15:38    Procedures Procedures (including critical care time)  Medications Ordered in ED Medications  morphine 4 MG/ML injection 4 mg (4 mg Intravenous Given 05/18/17 1315)  ondansetron (ZOFRAN) injection 4 mg (4 mg Intravenous Given 05/18/17 1315)  iopamidol (ISOVUE-300) 61 % injection (100 mLs  Contrast Given 05/18/17 1507)     Initial Impression / Assessment and  Plan / ED Course  I have reviewed the triage vital signs and the nursing notes.  Pertinent labs & imaging results that were available during my care of the patient were reviewed by me and considered in my medical decision making (see chart for details).    Brenda Reyes is a 61 y.o. female who presents to ED for abdominal pain that began this morning similar to previous SBO. On exam, patient is afebrile, hemodynamically stable with tenderness to central lower abdomen with guarding. She did have a bowel movement just prior to arrival today. She has been nauseous, but had no episodes of emesis. We will obtain labs and CT scan for further evaluation. Zofran and pain meds given as well.  Labs reviewed and reassuring. CT scan concerning for developing small bowel obstruction. Patient reevaluated and updated on scan results. Pain and nausea is controlled and she has not had any episodes of emesis while in the ED. General surgery consulted. Will need admission.    Final Clinical Impressions(s) / ED Diagnoses   Final diagnoses:  Abdominal pain  SBO (small bowel obstruction) [facility])    New Prescriptions New Prescriptions   No medications on file     Keyle Doby, Ozella Almond, PA-C 05/18/17 1616    Milton Ferguson, MD 05/19/17 1432

## 2017-05-18 NOTE — ED Notes (Signed)
Patient unable to give urine sample at this time

## 2017-05-18 NOTE — Progress Notes (Signed)
Pt admitted to unit at 1855hrs. Ordered meds requested from pharmacy as none available on unit. Will report to oncoming nurse

## 2017-05-18 NOTE — ED Notes (Signed)
Attempted report x1. 

## 2017-05-18 NOTE — Progress Notes (Signed)
Gastrogaffin administered at 1950hrs.

## 2017-05-18 NOTE — H&P (Signed)
[facility] Admission Note  Brenda Reyes 07/19/1956  [id number].    Requesting MD: Pearlie Oyster PA-C Chief Complaint/Reason for Consult: SBO HPI:  Patient is a 61 y.o. Female who presented to [facility] with abdominal pain and nausea since yesterday. Patient states abdominal pain is located in epigastric/periumbilical area, intermittent, and crampy in nature. Patient has associated bloating and nausea but no vomiting. Last BM this AM was normal, still passing some flatus. Denies fever, chills, diarrhea, constipation, bloody stools, chest pain, SOB, urinary symptoms. Patient had a SBO in 2015 which she was treated medically for, and states that the pain she currently feels is the same as the pain she felt then. PMH significant for HTN and hypothyroidism s/p thyroidectomy. Patient does not take any blood thinning medications. Patient reports she drinks on average 2 40 oz beers per week, but used to be a heavier drinker. Patient smokes cigarettes. Patient denies any illicit drug use past or current.   Abdominal Surgical history: tubal ligation, abdominal hysterectomy  ROS: Review of Systems  Constitutional: Negative for chills, fever, malaise/fatigue and weight loss.  Respiratory: Negative for shortness of breath.   Cardiovascular: Negative for chest pain and palpitations.  Gastrointestinal: Positive for abdominal pain and nausea. Negative for blood in stool, constipation, diarrhea, melena and vomiting.  Genitourinary: Negative for dysuria, frequency and urgency.  Endo/Heme/Allergies:       Patient has hypothyroidism  Psychiatric/Behavioral: Positive for depression. The patient is nervous/anxious.   All other systems reviewed and are negative.   Family History  Problem Relation Age of Onset  . Throat cancer Father   . Cancer - Other Mother        blood  . Colon cancer Neg Hx   . Stomach cancer Neg Hx     Past Medical History:  Diagnosis Date  . Borderline diabetes   . Colon  polyps 12/27/2009   Tubular adenomatous polyps  . Graves disease 02/12/15   to start treatment after colonoscopiy  . Hypertension   . Hyperthyroidism   . Internal hemorrhoids     Past Surgical History:  Procedure Laterality Date  . ABDOMINAL HYSTERECTOMY    . HEMORRHOID SURGERY    . RADIOACTIVE TYHROID IODINE    . TUBAL LIGATION      Social History:  reports that she has been smoking Cigarettes.  She has been smoking about 0.25 packs per day. She has never used smokeless tobacco. She reports that she drinks alcohol. She reports that she does not use drugs.  Allergies:  Allergies  Allergen Reactions  . Triamterene Other (See Comments)    Dizziness      (Not in a hospital admission)  Blood pressure 111/76, pulse 60, temperature 97.9 F (36.6 C), temperature source Oral, resp. rate 17, height '5\' 6"'  (1.676 m), weight 68 kg (150 lb), SpO2 95 %. Physical Exam: Physical Exam  Constitutional: She is oriented to person, place, and time. She appears well-developed and well-nourished. She is cooperative.  Non-toxic appearance. No distress.  HENT:  Head: Normocephalic and atraumatic.  Right Ear: External ear normal.  Left Ear: External ear normal.  Nose: Nose normal.  Mouth/Throat: Oropharynx is clear and moist and mucous membranes are normal.  Eyes: Conjunctivae and lids are normal.  Pupils equal and round  Neck: Trachea normal and normal range of motion. Neck supple.  Cardiovascular: Regular rhythm, S1 normal and S2 normal.  Bradycardia present.   Pulses:      Radial pulses are 2+ on the  right side, and 2+ on the left side.       Dorsalis pedis pulses are 2+ on the right side, and 2+ on the left side.  Pulmonary/Chest: Effort normal. She has no decreased breath sounds. She has no wheezes. She has no rhonchi. She has no rales.  Abdominal: Soft. Bowel sounds are normal. She exhibits no distension. There is no hepatosplenomegaly. There is tenderness in the epigastric area and  periumbilical area. There is no rigidity, no rebound and no guarding. No hernia.  Musculoskeletal: Normal range of motion.  Moving all extremities, no gross deformities or edema  Neurological: She is alert and oriented to person, place, and time. She has normal strength. No sensory deficit.  Skin: Skin is warm, dry and intact. She is not diaphoretic. No pallor.  Psychiatric: She has a normal mood and affect. Her behavior is normal. Judgment normal.    Results for orders placed or performed during the hospital encounter of 05/18/17 (from the past 48 hour(s))  Lipase, blood     Status: None   Collection Time: 05/18/17 12:00 PM  Result Value Ref Range   Lipase 29 11 - 51 U/L  Comprehensive metabolic panel     Status: Abnormal   Collection Time: 05/18/17 12:00 PM  Result Value Ref Range   Sodium 137 135 - 145 mmol/L   Potassium 3.2 (L) 3.5 - 5.1 mmol/L   Chloride 104 101 - 111 mmol/L   CO2 25 22 - 32 mmol/L   Glucose, Bld 120 (H) 65 - 99 mg/dL   BUN 18 6 - 20 mg/dL   Creatinine, Ser 0.65 0.44 - 1.00 mg/dL   Calcium 9.2 8.9 - 10.3 mg/dL   Total Protein 6.5 6.5 - 8.1 g/dL   Albumin 4.3 3.5 - 5.0 g/dL   AST 25 15 - 41 U/L   ALT 14 14 - 54 U/L   Alkaline Phosphatase 53 38 - 126 U/L   Total Bilirubin 0.6 0.3 - 1.2 mg/dL   GFR calc non Af Amer >60 >60 mL/min   GFR calc Af Amer >60 >60 mL/min    Comment: (NOTE) The eGFR has been calculated using the CKD EPI equation. This calculation has not been validated in all clinical situations. eGFR's persistently <60 mL/min signify possible Chronic Kidney Disease.    Anion gap 8 5 - 15  CBC     Status: None   Collection Time: 05/18/17 12:00 PM  Result Value Ref Range   WBC 7.1 4.0 - 10.5 K/uL   RBC 4.22 3.87 - 5.11 MIL/uL   Hemoglobin 12.7 12.0 - 15.0 g/dL   HCT 38.1 36.0 - 46.0 %   MCV 90.3 78.0 - 100.0 fL   MCH 30.1 26.0 - 34.0 pg   MCHC 33.3 30.0 - 36.0 g/dL   RDW 12.9 11.5 - 15.5 %   Platelets 286 150 - 400 K/uL  Urinalysis, Routine  w reflex microscopic     Status: Abnormal   Collection Time: 05/18/17 12:02 PM  Result Value Ref Range   Color, Urine YELLOW YELLOW   APPearance HAZY (A) CLEAR   Specific Gravity, Urine 1.024 1.005 - 1.030   pH 6.0 5.0 - 8.0   Glucose, UA 150 (A) NEGATIVE mg/dL   Hgb urine dipstick NEGATIVE NEGATIVE   Bilirubin Urine NEGATIVE NEGATIVE   Ketones, ur NEGATIVE NEGATIVE mg/dL   Protein, ur NEGATIVE NEGATIVE mg/dL   Nitrite NEGATIVE NEGATIVE   Leukocytes, UA SMALL (A) NEGATIVE   RBC / HPF  0-5 0 - 5 RBC/hpf   WBC, UA 0-5 0 - 5 WBC/hpf   Bacteria, UA RARE (A) NONE SEEN   Squamous Epithelial / LPF 0-5 (A) NONE SEEN   Mucous PRESENT    Budding Yeast PRESENT    Amorphous Crystal PRESENT    Ct Abdomen Pelvis W Contrast  Result Date: 05/18/2017 CLINICAL DATA:  Right lower quadrant abdomen pain since this morning. History of bowel obstruction. EXAM: CT ABDOMEN AND PELVIS WITH CONTRAST TECHNIQUE: Multidetector CT imaging of the abdomen and pelvis was performed using the standard protocol following bolus administration of intravenous contrast. CONTRAST:  17m ISOVUE-300 IOPAMIDOL (ISOVUE-300) INJECTION 61% COMPARISON:  August 17, 2014 FINDINGS: Lower chest: Calcified granulomas of the right lung base are identified. Minimal dependent atelectasis of bilateral lung bases are noted. There is no focal pneumonia or pleural effusion. The heart size is normal. Hepatobiliary: No focal liver abnormality is seen. No gallstones, gallbladder wall thickening, or biliary dilatation. Pancreas: Unremarkable. No pancreatic ductal dilatation or surrounding inflammatory changes. Spleen: Normal in size without focal abnormality. Adrenals/Urinary Tract: Adrenal glands are unremarkable. Kidneys are normal, without renal calculi, focal lesion, or hydronephrosis. Bladder is unremarkable. Stomach/Bowel: There are multiple dilated small bowel loops in the mid and lower abdomen. There is transition at a segment of thick wall small  bowel loop in the left upper pelvis. The ileal small bowel loops are normal. The appendix is normal. There is diverticulosis of colon without diverticulitis. Moderate bowel content is identified in the colon. The stomach is normal. Vascular/Lymphatic: No significant vascular findings are present. No enlarged abdominal or pelvic lymph nodes. Reproductive: Patient status post prior hysterectomy. Small free fluid is identified in the pelvis. Other: No abdominal hernia is identified. Musculoskeletal: Degenerative joint changes of the spine are identified. IMPRESSION: Developing small bowel obstruction with transition at a segment of thick wall small bowel loop in the left upper pelvis. Normal appendix.  Moderate bowel content in the colon. Electronically Signed   By: WAbelardo DieselM.D.   On: 05/18/2017 15:38    Assessment/Plan SBO - Hx of past SBO resolved with medical management - patient with good bowel sounds and not distended, but having pain and nausea - CT: Developing small bowel obstruction with transition at a segment of thick wall small bowel loop in the left upper pelvis - WBC 7, afebrile - no indications for immediate surgical intervention - initiate small bowel protocol  Hypothyroidism s/p thyroidectomy - check TSH level HTN - PRN metoprolol  Borderline Diabetes - CBGs q6 hrs  FEN - NPO/IVF. PRN antiemetics VTE- SCDs ID - no abx   Plan: Admit to med-surg. Initiate small bowel protocol. Repeat labs in AM.   KBrigid Re [facility] Center[facility] 05/18/2017, 4:05 PM Pager: [phone]: [phone]-Fri 7:00 am-4:30 pm Sat-Sun 7:00 am-11:30 am

## 2017-05-19 ENCOUNTER — Inpatient Hospital Stay (HOSPITAL_COMMUNITY)

## 2017-05-19 ENCOUNTER — Encounter (HOSPITAL_COMMUNITY): Payer: Self-pay | Admitting: General Practice

## 2017-05-19 LAB — GLUCOSE, CAPILLARY
GLUCOSE-CAPILLARY: 114 mg/dL — AB (ref 65–99)
GLUCOSE-CAPILLARY: 142 mg/dL — AB (ref 65–99)
Glucose-Capillary: 142 mg/dL — ABNORMAL HIGH (ref 65–99)
Glucose-Capillary: 123 mg/dL — ABNORMAL HIGH (ref 65–99)
Glucose-Capillary: 113 mg/dL — ABNORMAL HIGH (ref 65–99)

## 2017-05-19 LAB — HIV ANTIBODY (ROUTINE TESTING W REFLEX): HIV Screen 4th Generation wRfx: NONREACTIVE

## 2017-05-19 LAB — BASIC METABOLIC PANEL
Anion gap: 8 (ref 5–15)
Anion gap: 8 (ref 5–15)
BUN: 10 mg/dL (ref 6–20)
BUN: 9 mg/dL (ref 6–20)
CALCIUM: 9.4 mg/dL (ref 8.9–10.3)
CHLORIDE: 101 mmol/L (ref 101–111)
CHLORIDE: 102 mmol/L (ref 101–111)
CO2: 29 mmol/L (ref 22–32)
CO2: 28 mmol/L (ref 22–32)
CREATININE: 0.59 mg/dL (ref 0.44–1.00)
Calcium: 9.5 mg/dL (ref 8.9–10.3)
Creatinine, Ser: 0.62 mg/dL (ref 0.44–1.00)
GFR calc Af Amer: >60 mL/min (ref >60)
GFR calc Af Amer: >60 mL/min (ref >60)
GFR calc non Af Amer: >60 mL/min (ref >60)
GFR calc non Af Amer: >60 mL/min (ref >60)
GLUCOSE: 151 mg/dL — AB (ref 65–99)
GLUCOSE: 124 mg/dL — AB (ref 65–99)
POTASSIUM: 3.6 mmol/L (ref 3.5–5.1)
Potassium: 2.9 mmol/L — ABNORMAL LOW (ref 3.5–5.1)
Sodium: 138 mmol/L (ref 135–145)
Sodium: 138 mmol/L (ref 135–145)

## 2017-05-19 LAB — CBC
HCT: 41 % (ref 36.0–46.0)
Hemoglobin: 13.7 g/dL (ref 12.0–15.0)
MCH: 30.4 pg (ref 26.0–34.0)
MCHC: 33.4 g/dL (ref 30.0–36.0)
MCV: 91.1 fL (ref 78.0–100.0)
PLATELETS: 300 10*3/uL (ref 150–400)
RBC: 4.5 MIL/uL (ref 3.87–5.11)
RDW: 13 % (ref 11.5–15.5)
WBC: 7.5 10*3/uL (ref 4.0–10.5)

## 2017-05-19 MED ORDER — LEVOTHYROXINE SODIUM 100 MCG IV SOLR
44.0000 ug | Freq: Every day | INTRAVENOUS | Status: DC
Start: 2017-05-19 — End: 2017-05-21
  Administered 2017-05-19 – 2017-05-20 (×2): 44 ug via INTRAVENOUS
  Filled 2017-05-19 (×3): qty 5

## 2017-05-19 MED ORDER — POTASSIUM CHLORIDE 10 MEQ/100ML IV SOLN
10.0000 meq | INTRAVENOUS | Status: AC
  Administered 2017-05-19 (×2): 10 meq via INTRAVENOUS
  Filled 2017-05-19 (×2): qty 100

## 2017-05-19 MED ORDER — KCL IN DEXTROSE-NACL 40-5-0.45 MEQ/L-%-% IV SOLN
INTRAVENOUS | Status: DC
  Administered 2017-05-19 – 2017-05-20 (×4): via INTRAVENOUS
  Filled 2017-05-19 (×5): qty 1000

## 2017-05-19 MED ORDER — ENOXAPARIN SODIUM 40 MG/0.4ML ~~LOC~~ SOLN
40.0000 mg | SUBCUTANEOUS | Status: DC
  Administered 2017-05-19 – 2017-05-21 (×3): 40 mg via SUBCUTANEOUS
  Filled 2017-05-19 (×3): qty 0.4

## 2017-05-19 MED ORDER — POTASSIUM CHLORIDE 10 MEQ/100ML IV SOLN
10.0000 meq | INTRAVENOUS | Status: DC
  Administered 2017-05-19 (×4): 10 meq via INTRAVENOUS
  Filled 2017-05-19 (×4): qty 100

## 2017-05-19 NOTE — Progress Notes (Signed)
Subjective/Chief Complaint: CC no flatus but having a lot of abdominal "rumbling"   Objective: Vital signs in last 24 hours: Temp:  [97.8 F (36.6 C)-98.6 F (37 C)] 98.6 F (37 C) (07/10 0535) Pulse Rate:  [56-71] 58 (07/10 0535) Resp:  [14-21] 17 (07/10 0535) BP: (111-147)/(70-94) 140/79 (07/10 0535) SpO2:  [95 %-100 %] 98 % (07/10 0535) Weight:  [68 kg (150 lb)] 68 kg (150 lb) (07/09 1200) Last BM Date: 05/18/17  Intake/Output from previous day: 07/09 0701 - 07/10 0700 In: 1073.8 [I.V.:1043.8; NG/GT:30] Out: 950 [Urine:200; Emesis/NG output:750] Intake/Output this shift: Total I/O In: -  Out: 50 [Emesis/NG output:50]  General appearance: alert and cooperative Resp: clear to auscultation bilaterally Cardio: regular rate and rhythm GI: some distention but soft, NT, very active BS  Lab Results:   Recent Labs  05/18/17 1200 05/19/17 0508  WBC 7.1 7.5  HGB 12.7 13.7  HCT 38.1 41.0  PLT 286 300   BMET  Recent Labs  05/18/17 1200 05/19/17 0508  NA 137 138  K 3.2* 2.9*  CL 104 101  CO2 25 29  GLUCOSE 120* 151*  BUN 18 10  CREATININE 0.65 0.59  CALCIUM 9.2 9.4   PT/INR No results for input(s): LABPROT, INR in the last 72 hours. ABG No results for input(s): PHART, HCO3 in the last 72 hours.  Invalid input(s): PCO2, PO2  Studies/Results: Ct Abdomen Pelvis W Contrast  Result Date: 05/18/2017 CLINICAL DATA:  Right lower quadrant abdomen pain since this morning. History of bowel obstruction. EXAM: CT ABDOMEN AND PELVIS WITH CONTRAST TECHNIQUE: Multidetector CT imaging of the abdomen and pelvis was performed using the standard protocol following bolus administration of intravenous contrast. CONTRAST:  16mL ISOVUE-300 IOPAMIDOL (ISOVUE-300) INJECTION 61% COMPARISON:  August 17, 2014 FINDINGS: Lower chest: Calcified granulomas of the right lung base are identified. Minimal dependent atelectasis of bilateral lung bases are noted. There is no focal pneumonia  or pleural effusion. The heart size is normal. Hepatobiliary: No focal liver abnormality is seen. No gallstones, gallbladder wall thickening, or biliary dilatation. Pancreas: Unremarkable. No pancreatic ductal dilatation or surrounding inflammatory changes. Spleen: Normal in size without focal abnormality. Adrenals/Urinary Tract: Adrenal glands are unremarkable. Kidneys are normal, without renal calculi, focal lesion, or hydronephrosis. Bladder is unremarkable. Stomach/Bowel: There are multiple dilated small bowel loops in the mid and lower abdomen. There is transition at a segment of thick wall small bowel loop in the left upper pelvis. The ileal small bowel loops are normal. The appendix is normal. There is diverticulosis of colon without diverticulitis. Moderate bowel content is identified in the colon. The stomach is normal. Vascular/Lymphatic: No significant vascular findings are present. No enlarged abdominal or pelvic lymph nodes. Reproductive: Patient status post prior hysterectomy. Small free fluid is identified in the pelvis. Other: No abdominal hernia is identified. Musculoskeletal: Degenerative joint changes of the spine are identified. IMPRESSION: Developing small bowel obstruction with transition at a segment of thick wall small bowel loop in the left upper pelvis. Normal appendix.  Moderate bowel content in the colon. Electronically Signed   By: Abelardo Diesel M.D.   On: 05/18/2017 15:38   Dg Abd Portable 1v-small Bowel Obstruction Protocol-initial, 8 Hr Delay  Result Date: 05/19/2017 CLINICAL DATA:  61 year old female with 8 hour delay. Subsequent encounter. EXAM: PORTABLE ABDOMEN - 1 VIEW COMPARISON:  05/18/2017 CT and plain film exam. FINDINGS: Residual contrast within the fundus of the stomach. Complete stomach not included on present exam and nasogastric tube not  well delineated. Residual contrast within the urinary bladder from recent CT. Gas distended small bowel loops measuring up to 3.8  cm. Slightly thickened folds. Findings consistent with small bowel obstruction. The possibility of free intraperitoneal air cannot be assessed on a supine view. IMPRESSION: Findings consistent with small bowel obstruction with slightly thickened folds of gas distended small bowel loops minimally more notable than on prior exam. Electronically Signed   By: Genia Del M.D.   On: 05/19/2017 07:04   Dg Abd Portable 1v-small Bowel Protocol-position Verification  Result Date: 05/18/2017 CLINICAL DATA:  NG tube placement, periumbilical pain EXAM: PORTABLE ABDOMEN - 1 VIEW COMPARISON:  05/18/2017 FINDINGS: Lung bases are clear. Right paraspinal calcification at T10 corresponds to a densely calcified lung nodule. Esophageal tube tip overlies the proximal stomach, side-port just beyond GE junction. Residual contrast in the collecting system of the kidneys. Dilated small bowel in the central abdomen. IMPRESSION: 1. Esophageal tube tip overlies proximal stomach, side-port just beyond GE junction, could advance by 5 cm for slightly more optimal positioning 2. Residual contrast in the collecting system of the kidneys Electronically Signed   By: Donavan Foil M.D.   On: 05/18/2017 18:53    Anti-infectives: Anti-infectives    None      Assessment/Plan: SBO - no progress on F/U film. Continue NGT, x-rays in AM Hypokalemia - replace VTE - Lovenox, PAS  LOS: 1 day    Macklen Wilhoite E 05/19/2017

## 2017-05-19 NOTE — Care Management Note (Signed)
Case Management Note  Patient Details  Name: LAETITIA SCHNEPF MRN: [id number] Date of Birth: 13-Dec-1955  Subjective/Objective:                 Independent patient from home with sister. Patient still works, drives, denies difficulties with medications or self care. SBO w NGT. Now reports passing flatus. No CM concerns for DC at this time.  PCP Criss Rosales    Action/Plan:  CM will continue to follow for DC needs.  Expected Discharge Date:                  Expected Discharge Plan:  Home/Self Care  In-House Referral:     Discharge planning Services  CM Consult  Post Acute Care Choice:    Choice offered to:     DME Arranged:    DME Agency:     HH Arranged:    HH Agency:     Status of Service:  In process, will continue to follow  If discussed at Long Length of Stay Meetings, dates discussed:    Additional Comments:  Carles Collet, RN 05/19/2017, 2:14 PM

## 2017-05-20 LAB — GLUCOSE, CAPILLARY
GLUCOSE-CAPILLARY: 115 mg/dL — AB (ref 65–99)
GLUCOSE-CAPILLARY: 117 mg/dL — AB (ref 65–99)
GLUCOSE-CAPILLARY: 117 mg/dL — AB (ref 65–99)

## 2017-05-20 LAB — CBC
HCT: 39.3 % (ref 36.0–46.0)
Hemoglobin: 12.8 g/dL (ref 12.0–15.0)
MCH: 30 pg (ref 26.0–34.0)
MCHC: 32.6 g/dL (ref 30.0–36.0)
MCV: 92 fL (ref 78.0–100.0)
PLATELETS: 272 10*3/uL (ref 150–400)
RBC: 4.27 MIL/uL (ref 3.87–5.11)
RDW: 13.1 % (ref 11.5–15.5)
WBC: 5.5 10*3/uL (ref 4.0–10.5)

## 2017-05-20 LAB — BASIC METABOLIC PANEL
ANION GAP: 7 (ref 5–15)
BUN: 5 mg/dL — ABNORMAL LOW (ref 6–20)
CALCIUM: 9.2 mg/dL (ref 8.9–10.3)
CO2: 28 mmol/L (ref 22–32)
Chloride: 104 mmol/L (ref 101–111)
Creatinine, Ser: 0.65 mg/dL (ref 0.44–1.00)
Glucose, Bld: 118 mg/dL — ABNORMAL HIGH (ref 65–99)
Potassium: 3.7 mmol/L (ref 3.5–5.1)
Sodium: 139 mmol/L (ref 135–145)

## 2017-05-20 NOTE — Progress Notes (Signed)
Subjective/Chief Complaint: Several BMs, no abd pain   Objective: Vital signs in last 24 hours: Temp:  [98.1 F (36.7 C)-99 F (37.2 C)] 98.8 F (37.1 C) (07/11 0533) Pulse Rate:  [61-66] 66 (07/11 0533) Resp:  [16] 16 (07/10 1328) BP: (134-150)/(73-89) 137/85 (07/11 0533) SpO2:  [96 %-100 %] 96 % (07/11 0533) Last BM Date: 05/19/17  Intake/Output from previous day: 07/10 0701 - 07/11 0700 In: 2427.9 [I.V.:2397.9; NG/GT:30] Out: 350 [Emesis/NG output:350] Intake/Output this shift: No intake/output data recorded.  General appearance: cooperative Resp: clear to auscultation bilaterally Cardio: regular rate and rhythm GI: soft, less distended, +BS  Lab Results:   Recent Labs  05/19/17 0508 05/20/17 0348  WBC 7.5 5.5  HGB 13.7 12.8  HCT 41.0 39.3  PLT 300 272   BMET  Recent Labs  05/19/17 1315 05/20/17 0348  NA 138 139  K 3.6 3.7  CL 102 104  CO2 28 28  GLUCOSE 124* 118*  BUN 9 5*  CREATININE 0.62 0.65  CALCIUM 9.5 9.2   PT/INR No results for input(s): LABPROT, INR in the last 72 hours. ABG No results for input(s): PHART, HCO3 in the last 72 hours.  Invalid input(s): PCO2, PO2  Studies/Results: Ct Abdomen Pelvis W Contrast  Result Date: 05/18/2017 CLINICAL DATA:  Right lower quadrant abdomen pain since this morning. History of bowel obstruction. EXAM: CT ABDOMEN AND PELVIS WITH CONTRAST TECHNIQUE: Multidetector CT imaging of the abdomen and pelvis was performed using the standard protocol following bolus administration of intravenous contrast. CONTRAST:  121mL ISOVUE-300 IOPAMIDOL (ISOVUE-300) INJECTION 61% COMPARISON:  August 17, 2014 FINDINGS: Lower chest: Calcified granulomas of the right lung base are identified. Minimal dependent atelectasis of bilateral lung bases are noted. There is no focal pneumonia or pleural effusion. The heart size is normal. Hepatobiliary: No focal liver abnormality is seen. No gallstones, gallbladder wall thickening, or  biliary dilatation. Pancreas: Unremarkable. No pancreatic ductal dilatation or surrounding inflammatory changes. Spleen: Normal in size without focal abnormality. Adrenals/Urinary Tract: Adrenal glands are unremarkable. Kidneys are normal, without renal calculi, focal lesion, or hydronephrosis. Bladder is unremarkable. Stomach/Bowel: There are multiple dilated small bowel loops in the mid and lower abdomen. There is transition at a segment of thick wall small bowel loop in the left upper pelvis. The ileal small bowel loops are normal. The appendix is normal. There is diverticulosis of colon without diverticulitis. Moderate bowel content is identified in the colon. The stomach is normal. Vascular/Lymphatic: No significant vascular findings are present. No enlarged abdominal or pelvic lymph nodes. Reproductive: Patient status post prior hysterectomy. Small free fluid is identified in the pelvis. Other: No abdominal hernia is identified. Musculoskeletal: Degenerative joint changes of the spine are identified. IMPRESSION: Developing small bowel obstruction with transition at a segment of thick wall small bowel loop in the left upper pelvis. Normal appendix.  Moderate bowel content in the colon. Electronically Signed   By: Abelardo Diesel M.D.   On: 05/18/2017 15:38   Dg Abd Portable 1v-small Bowel Obstruction Protocol-24 Hr Delay  Result Date: 05/19/2017 CLINICAL DATA:  24 hour small bowel delay EXAM: PORTABLE ABDOMEN - 1 VIEW COMPARISON:  05/19/2017, 05/18/2017 FINDINGS: Esophageal tube tip is in the left upper quadrant, side-port in the region of GE junction. Decreased gaseous dilatation of central small bowel. Contrast is present within the colon. Calcified pelvic phleboliths. Calcified nodule at the medial right lung base. IMPRESSION: 1. Decreased central small bowel dilatation. There is contrast throughout the colon. Electronically Signed  By: Donavan Foil M.D.   On: 05/19/2017 22:14   Dg Abd Portable  1v-small Bowel Obstruction Protocol-initial, 8 Hr Delay  Result Date: 05/19/2017 CLINICAL DATA:  61 year old female with 8 hour delay. Subsequent encounter. EXAM: PORTABLE ABDOMEN - 1 VIEW COMPARISON:  05/18/2017 CT and plain film exam. FINDINGS: Residual contrast within the fundus of the stomach. Complete stomach not included on present exam and nasogastric tube not well delineated. Residual contrast within the urinary bladder from recent CT. Gas distended small bowel loops measuring up to 3.8 cm. Slightly thickened folds. Findings consistent with small bowel obstruction. The possibility of free intraperitoneal air cannot be assessed on a supine view. IMPRESSION: Findings consistent with small bowel obstruction with slightly thickened folds of gas distended small bowel loops minimally more notable than on prior exam. Electronically Signed   By: Genia Del M.D.   On: 05/19/2017 07:04   Dg Abd Portable 1v-small Bowel Protocol-position Verification  Result Date: 05/18/2017 CLINICAL DATA:  NG tube placement, periumbilical pain EXAM: PORTABLE ABDOMEN - 1 VIEW COMPARISON:  05/18/2017 FINDINGS: Lung bases are clear. Right paraspinal calcification at T10 corresponds to a densely calcified lung nodule. Esophageal tube tip overlies the proximal stomach, side-port just beyond GE junction. Residual contrast in the collecting system of the kidneys. Dilated small bowel in the central abdomen. IMPRESSION: 1. Esophageal tube tip overlies proximal stomach, side-port just beyond GE junction, could advance by 5 cm for slightly more optimal positioning 2. Residual contrast in the collecting system of the kidneys Electronically Signed   By: Donavan Foil M.D.   On: 05/18/2017 18:53    Anti-infectives: Anti-infectives    None      Assessment/Plan: SBO - contrast in colon and several BMs, D/C NGT, clears now and fulls tonight Hypokalemia - better FEN - decrease IVF VTE - Lovenox, PAS  LOS: 2 days     Brenda Reyes E 05/20/2017

## 2017-05-20 NOTE — Plan of Care (Signed)
Problem: Safety: Goal: Ability to remain free from injury will improve Outcome: Progressing Pt will be free from falls and injuries during this hospitalization.  Problem: Bowel/Gastric: Goal: Will not experience complications related to bowel motility Outcome: Progressing Pt's small bowel obstruction will be resolved prior to discharge.

## 2017-05-21 LAB — GLUCOSE, CAPILLARY
Glucose-Capillary: 106 mg/dL — ABNORMAL HIGH (ref 65–99)
Glucose-Capillary: 96 mg/dL (ref 65–99)
Glucose-Capillary: 97 mg/dL (ref 65–99)

## 2017-05-21 MED ORDER — LEVOTHYROXINE SODIUM 88 MCG PO TABS
88.0000 ug | ORAL_TABLET | Freq: Every day | ORAL | Status: DC
  Administered 2017-05-21: 88 ug via ORAL
  Filled 2017-05-21: qty 1

## 2017-05-21 NOTE — Discharge Instructions (Signed)
Small Bowel Obstruction °A small bowel obstruction means that something is blocking the small bowel. The small bowel is also called the small intestine. It is the long tube that connects the stomach to the colon. An obstruction will stop food and fluids from passing through the small bowel. Treatment depends on what is causing the problem and how bad the problem is. °Follow these instructions at home: °· Get a lot of rest. °· Follow your diet as told by your doctor. You may need to: °? Only drink clear liquids until you start to get better. °? Avoid solid foods as told by your doctor. °· Take over-the-counter and prescription medicines only as told by your doctor. °· Keep all follow-up visits as told by your doctor. This is important. °Contact a doctor if: °· You have a fever. °· You have chills. °Get help right away if: °· You have pain or cramps that get worse. °· You throw up (vomit) blood. °· You have a feeling of being sick to your stomach (nausea) that does not go away. °· You cannot stop throwing up. °· You cannot drink fluids. °· You feel confused. °· You feel dry or thirsty (dehydrated). °· Your belly gets more bloated. °· You feel weak or you pass out (faint). °This information is not intended to replace advice given to you by your health care provider. Make sure you discuss any questions you have with your health care provider. °Document Released: 12/04/2004 Document Revised: 06/23/2016 Document Reviewed: 12/21/2014 °Elsevier Interactive Patient Education © 2018 [employer] ° °

## 2017-05-21 NOTE — Discharge Summary (Signed)
[facility] Discharge Summary   Patient ID: Brenda Reyes MRN: [id number] DOB/AGE: 1956-06-07 61 y.o.  Admit date: 05/18/2017 Discharge date: 05/21/17  Admitting Diagnosis: SBO HTN Hypothyroidism   Discharge Diagnosis SBO - resolved  HTN Hypothyroidism  Consultants None   Procedures None  Hospital Course:  Patient is a 61 yo female who presented to [facility] with abdominal pain and nausea.  Workup in the ED consisted of CT which showed developing SBO.  Patient was admitted and initiated on the small bowel protocol. Initial follow up film showed no progression of contrast into the colon, a repeat film was ordered for the following morning. Patient noted to have low potassium on 05/19/17, potassium was replaced and repeat labs showed resolution of hypokalemia. Repeat abdominal film showed contrast in the colon and patient was clinically improved having several BMs. Diet was advanced as tolerated. On day of discharge patient was tolerating diet, voiding appropriately, and pain was improved. Patient was discharged home in good condition.   I have personally looked this patient up in the [place] Controlled Substance Database and reviewed their medications.  Allergies as of 05/21/2017      Reactions   Triamterene Other (See Comments)   Dizziness      Medication List    TAKE these medications   amLODipine 10 MG tablet Commonly known as:  NORVASC Take 10 mg by mouth daily.   CALTRATE 600+D 600-400 MG-UNIT tablet Generic drug:  Calcium Carbonate-Vitamin D Take 1 tablet by mouth daily.   cloNIDine 0.2 MG tablet Commonly known as:  CATAPRES Take 0.2 mg by mouth at bedtime as needed (bllod pressure).   levothyroxine 88 MCG tablet Commonly known as:  SYNTHROID, LEVOTHROID Take 88 mcg by mouth daily before breakfast.   lisinopril-hydrochlorothiazide 20-12.5 MG tablet Commonly known as:  PRINZIDE,ZESTORETIC Take 2 tablets by mouth daily.   meloxicam 15 MG tablet Commonly  known as:  MOBIC Take 15 mg by mouth daily as needed for pain.   metoprolol 200 MG 24 hr tablet Commonly known as:  TOPROL-XL Take 200 mg by mouth daily.   potassium chloride 10 MEQ tablet Commonly known as:  K-DUR Take 10 mEq by mouth once.   thiamine 100 MG tablet Commonly known as:  VITAMIN B-1 Take 100 mg by mouth daily.        Follow-up Information    [facility]. Call.   Specialty:  General Surgery Why:  Call with questions or concerns. You may follow up as needed.  Contact information: [address] [place]  [id number] [phone]           Signed: Brigid Re , [facility] 05/21/2017, 4:03 PM Pager: [phone] Consults: [phone] Mon-Fri 7:00 am-4:30 pm Sat-Sun 7:00 am-11:30 am

## 2017-05-21 NOTE — Progress Notes (Signed)
Discharge instructions reviewed with pt.  Pt verbalized understanding and had no questions.  Pt discharged in stable condition.  Brenda Reyes [place]

## 2017-05-21 NOTE — Progress Notes (Signed)
   Subjective/Chief Complaint: Had BMs, tolerated fulls   Objective: Vital signs in last 24 hours: Temp:  [98.2 F (36.8 C)-98.4 F (36.9 C)] 98.3 F (36.8 C) (07/12 0544) Pulse Rate:  [64-77] 77 (07/12 0544) Resp:  [16-18] 16 (07/12 0544) BP: (139-151)/(84-91) 148/88 (07/12 0544) SpO2:  [99 %-100 %] 99 % (07/12 0544) Last BM Date: 05/20/17  Intake/Output from previous day: 07/11 0701 - 07/12 0700 In: 480 [P.O.:480] Out: 1300 [Urine:1300] Intake/Output this shift: No intake/output data recorded.  General appearance: alert and cooperative Resp: clear to auscultation bilaterally Cardio: regular rate and rhythm GI: soft, less distended, +BS, NT  Lab Results:   Recent Labs  05/19/17 0508 05/20/17 0348  WBC 7.5 5.5  HGB 13.7 12.8  HCT 41.0 39.3  PLT 300 272   BMET  Recent Labs  05/19/17 1315 05/20/17 0348  NA 138 139  K 3.6 3.7  CL 102 104  CO2 28 28  GLUCOSE 124* 118*  BUN 9 5*  CREATININE 0.62 0.65  CALCIUM 9.5 9.2   PT/INR No results for input(s): LABPROT, INR in the last 72 hours. ABG No results for input(s): PHART, HCO3 in the last 72 hours.  Invalid input(s): PCO2, PO2  Studies/Results: Dg Abd Portable 1v-small Bowel Obstruction Protocol-24 Hr Delay  Result Date: 05/19/2017 CLINICAL DATA:  24 hour small bowel delay EXAM: PORTABLE ABDOMEN - 1 VIEW COMPARISON:  05/19/2017, 05/18/2017 FINDINGS: Esophageal tube tip is in the left upper quadrant, side-port in the region of GE junction. Decreased gaseous dilatation of central small bowel. Contrast is present within the colon. Calcified pelvic phleboliths. Calcified nodule at the medial right lung base. IMPRESSION: 1. Decreased central small bowel dilatation. There is contrast throughout the colon. Electronically Signed   By: Donavan Foil M.D.   On: 05/19/2017 22:14    Anti-infectives: Anti-infectives    None      Assessment/Plan: SBO - resolving, soft diet and possibly D/C later  today Hypokalemia - better VTE - Lovenox, PAS  LOS: 3 days    Brenda Reyes E 05/21/2017

## 2017-09-03 ENCOUNTER — Ambulatory Visit (HOSPITAL_COMMUNITY)
Admission: EM | Admit: 2017-09-03 | Discharge: 2017-09-03 | Disposition: A | Discharge status: Home / Self Care | Payer: TRICARE For Life (TFL) | Attending: Family Medicine | Admitting: Family Medicine

## 2017-09-03 ENCOUNTER — Encounter (HOSPITAL_COMMUNITY): Payer: Self-pay | Admitting: *Deleted

## 2017-09-03 DIAGNOSIS — B9789 Other viral agents as the cause of diseases classified elsewhere: Secondary | ICD-10-CM

## 2017-09-03 DIAGNOSIS — J069 Acute upper respiratory infection, unspecified: Secondary | ICD-10-CM

## 2017-09-03 NOTE — ED Provider Notes (Signed)
[facility]    CSN: [id number] Arrival date & time: 09/03/17  1017     History   Chief Complaint Chief Complaint  Patient presents with  . Cough  . Fever    HPI Brenda Reyes is a 61 y.o. female.   Randell presents with complaints of cough and mild sore throat which started 10/19. She had a fever 10/19 and 10/21, but has not had one since. Denies shortness of breath, chest pain, headache, ear pain, gi/gu complaints. She is eating and drinking normally. Without shortness of breath. Cough is productive. She states she does have a runny nose. No known ill contacts. Has been taking tylenol and robitussin which have helped. Cough is worse at night. Without history of asthma. She states last week she has been intermittently smoking cigarettes, has not smoked in a week. She states she generally feels she is improving.   ROS per HPI.       Past Medical History:  Diagnosis Date  . Borderline diabetes   . Colon polyps 12/27/2009   Tubular adenomatous polyps  . Graves disease 02/12/15   to start treatment after colonoscopiy  . Hypertension   . Hyperthyroidism   . Internal hemorrhoids   . SBO (small bowel obstruction) ([place]) 05/2017    Patient Active Problem List   Diagnosis Date Noted  . SBO (small bowel obstruction) ([place]) 04/13/2013  . TOBACCO USER 11/15/2009  . URI 11/15/2009  . FIBROIDS, UTERUS 04/19/2007  . ANEMIA, IRON DEFICIENCY NOS 04/19/2007  . ABUSE, ALCOHOL, UNSPECIFIED 04/19/2007  . HYPERTENSION, ESSENTIAL NOS 04/19/2007  . Hemorrhoids 04/19/2007  . HYSTERECTOMY, HX OF 09/10/2006    Past Surgical History:  Procedure Laterality Date  . ABDOMINAL HYSTERECTOMY    . HEMORRHOID SURGERY    . RADIOACTIVE TYHROID IODINE    . TUBAL LIGATION      OB History    No data available       Home Medications    Prior to Admission medications   Medication Sig Start Date End Date Taking? Authorizing Provider  amLODipine (NORVASC) 10 MG tablet Take 10 mg  by mouth daily. 11/29/12   Johnson, Clanford L, MD  Calcium Carbonate-Vitamin D (CALTRATE 600+D) 600-400 MG-UNIT per tablet Take 1 tablet by mouth daily.    [provider]  cloNIDine (CATAPRES) 0.2 MG tablet Take 0.2 mg by mouth at bedtime as needed (bllod pressure).  09/15/12   Rosana Hoes, MD  levothyroxine (SYNTHROID, LEVOTHROID) 88 MCG tablet Take 88 mcg by mouth daily before breakfast.    [provider]  lisinopril-hydrochlorothiazide (PRINZIDE,ZESTORETIC) 20-12.5 MG per tablet Take 2 tablets by mouth daily. 11/29/12   Johnson, Clanford L, MD  meloxicam (MOBIC) 15 MG tablet Take 15 mg by mouth daily as needed for pain.    [provider]  metoprolol (TOPROL-XL) 200 MG 24 hr tablet Take 200 mg by mouth daily.    [provider]  potassium chloride (K-DUR) 10 MEQ tablet Take 10 mEq by mouth once.  01/14/13   Caren Griffins, MD  thiamine (VITAMIN B-1) 100 MG tablet Take 100 mg by mouth daily. 11/29/12   Murlean Iba, MD    Family History Family History  Problem Relation Age of Onset  . Throat cancer Father   . Cancer - Other Mother        blood  . Colon cancer Neg Hx   . Stomach cancer Neg Hx     Social History Social History  Substance  Use Topics  . Smoking status: Former Smoker    Packs/day: 0.25    Types: Cigarettes    Quit date: 05/05/2017  . Smokeless tobacco: Never Used     Comment: no cigarattes in 7 days per pt.   . Alcohol use 0.0 oz/week     Comment: not drinking per patient, none in "7 days", in "AA" per pt.     Allergies   Triamterene   Review of Systems Review of Systems   Physical Exam Triage Vital Signs ED Triage Vitals [09/03/17 1036]  Enc Vitals Group     BP 138/79     Pulse Rate 70     Resp      Temp 98.4 F (36.9 C)     Temp Source Oral     SpO2 100 %     Weight      Height      Head Circumference      Peak Flow      Pain Score      Pain Loc      Pain Edu?      Excl. in [place]?    No data  found.   Updated Vital Signs BP 138/79   Pulse 70   Temp 98.4 F (36.9 C) (Oral)   SpO2 100%   Visual Acuity Right Eye Distance:   Left Eye Distance:   Bilateral Distance:    Right Eye Near:   Left Eye Near:    Bilateral Near:     Physical Exam  Constitutional: She is oriented to person, place, and time. She appears well-developed and well-nourished.  HENT:  Head: Normocephalic.  Right Ear: Tympanic membrane, external ear and ear canal normal.  Left Ear: External ear and ear canal normal.  Nose: Rhinorrhea present. Right sinus exhibits no maxillary sinus tenderness and no frontal sinus tenderness. Left sinus exhibits no maxillary sinus tenderness and no frontal sinus tenderness.  Mouth/Throat: Uvula is midline, oropharynx is clear and moist and mucous membranes are normal.  Eyes: Pupils are equal, round, and reactive to light. Conjunctivae are normal.  Cardiovascular: Normal rate and regular rhythm.   Pulmonary/Chest: Effort normal and breath sounds normal. No respiratory distress. She has no wheezes. She exhibits no tenderness.  Lungs clear; without cough throughout exam  Neurological: She is alert and oriented to person, place, and time.  Skin: Skin is warm and dry.  Vitals reviewed.    UC Treatments / Results  Labs (all labs ordered are listed, but only abnormal results are displayed) Labs Reviewed - No data to display  EKG  EKG Interpretation None       Radiology No results found.  Procedures Procedures (including critical care time)  Medications Ordered in UC Medications - No data to display   Initial Impression / Assessment and Plan / UC Course  I have reviewed the triage vital signs and the nursing notes.  Pertinent labs & imaging results that were available during my care of the patient were reviewed by me and considered in my medical decision making (see chart for details).     Vitals stable. Non toxic in appearance, without acute distress.  History and physical consistent with viral illness. Supportive cares recommended. Push fluids. mucinex as an expectorant may be helpful. Tylenol as needed for pain. Continue to quit smoking. If symptoms worsen or do not improve in the next week to return to be seen or to follow up with PCP. Patient verbalized understanding and agreeable to plan.  Zigmund Gottron, NP 09/03/2017 11:02 AM    Final Clinical Impressions(s) / UC Diagnoses   Final diagnoses:  Viral URI with cough    New Prescriptions New Prescriptions   No medications on file     Controlled Substance Prescriptions [place] Controlled Substance Registry consulted? Not Applicable   Zigmund Gottron, NP 09/03/17 1102

## 2017-09-03 NOTE — ED Triage Notes (Signed)
Patient reports reproductive cough since Friday with generalized body aches and fever.

## 2019-01-09 DEATH — deceased

## 2019-12-01 ENCOUNTER — Ambulatory Visit: Attending: Internal Medicine

## 2019-12-01 DIAGNOSIS — Z20822 Contact with and (suspected) exposure to covid-19: Secondary | ICD-10-CM

## 2019-12-02 LAB — NOVEL CORONAVIRUS, NAA: SARS-CoV-2, NAA: NOT DETECTED

## 2020-01-17 ENCOUNTER — Telehealth: Payer: Self-pay | Admitting: Sports Medicine

## 2020-01-17 ENCOUNTER — Ambulatory Visit (INDEPENDENT_AMBULATORY_CARE_PROVIDER_SITE_OTHER)

## 2020-01-17 ENCOUNTER — Other Ambulatory Visit: Payer: Self-pay

## 2020-01-17 ENCOUNTER — Ambulatory Visit (INDEPENDENT_AMBULATORY_CARE_PROVIDER_SITE_OTHER): Admitting: Sports Medicine

## 2020-01-17 ENCOUNTER — Other Ambulatory Visit: Payer: Self-pay | Admitting: Sports Medicine

## 2020-01-17 ENCOUNTER — Encounter: Payer: Self-pay | Admitting: Sports Medicine

## 2020-01-17 VITALS — BP 154/90 | HR 61

## 2020-01-17 DIAGNOSIS — M722 Plantar fascial fibromatosis: Secondary | ICD-10-CM | POA: Diagnosis not present

## 2020-01-17 DIAGNOSIS — M79672 Pain in left foot: Secondary | ICD-10-CM

## 2020-01-17 NOTE — Progress Notes (Signed)
Subjective: Brenda Reyes is a 64 y.o. 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 Patient has custom orthotics but reports that it has been a while since she got new ones. No other issues noted.  Review of Systems  All other systems reviewed and are negative.    Patient Active Problem List   Diagnosis Date Noted  . SBO (small bowel obstruction) ([place]) 04/13/2013  . TOBACCO USER 11/15/2009  . URI 11/15/2009  . FIBROIDS, UTERUS 04/19/2007  . ANEMIA, IRON DEFICIENCY NOS 04/19/2007  . ABUSE, ALCOHOL, UNSPECIFIED 04/19/2007  . HYPERTENSION, ESSENTIAL NOS 04/19/2007  . Hemorrhoids 04/19/2007  . HYSTERECTOMY, HX OF 09/10/2006    Current Outpatient Medications on File Prior to Visit  Medication Sig Dispense Refill  . amLODipine (NORVASC) 10 MG tablet Take 10 mg by mouth daily.    . Calcium Carbonate-Vitamin D (CALTRATE 600+D) 600-400 MG-UNIT per tablet Take 1 tablet by mouth daily.    . cloNIDine (CATAPRES) 0.2 MG tablet Take 0.2 mg by mouth at bedtime as needed (bllod pressure).     Marland Kitchen levothyroxine (SYNTHROID) 100 MCG tablet     . levothyroxine (SYNTHROID, LEVOTHROID) 88 MCG tablet Take 88 mcg by mouth daily before breakfast.    . lisinopril-hydrochlorothiazide (PRINZIDE,ZESTORETIC) 20-12.5 MG per tablet Take 2 tablets by mouth daily.    . meloxicam (MOBIC) 15 MG tablet Take 15 mg by mouth daily as needed for pain.    . metoprolol (TOPROL-XL) 200 MG 24 hr tablet Take 200 mg by mouth daily.    . potassium chloride (K-DUR) 10 MEQ tablet Take 10 mEq by mouth once.     . thiamine (VITAMIN B-1) 100 MG tablet Take 100 mg by mouth daily.     No current facility-administered medications on file prior to visit.    Allergies  Allergen Reactions  . Triamterene Other (See Comments)    Dizziness     Objective: Physical Exam General: The patient is alert  and oriented x3 in no acute distress.  Dermatology: Skin is warm, dry and supple bilateral lower extremities. Nails 1-10 are normal. There is no erythema, edema, no eccymosis, no open lesions present. 2 raised masses in arch larger one measures 1cm and smaller measures 0.5cm consistent with plantar fibromas on left. Integument is otherwise unremarkable.  Vascular: Dorsalis Pedis pulse and Posterior Tibial pulse are 2/4 bilateral. Capillary fill time is immediate to all digits.  Neurological: Grossly intact to light touch with an achilles reflex of +2/5 and a  negative Tinel's sign bilateral.  Musculoskeletal: Tenderness to palpation at the medial arch on left with bow string plantar fascia. Strength 5/5 in all groups bilateral.   Gait: Unassisted  Xray, Left foot:  Normal osseous mineralization. Joint spaces preserved. No fracture/dislocation/boney destruction. Minimal Calcaneal spur present with mild thickening of plantar fascia. No other soft tissue abnormalities or radiopaque foreign bodies.   Assessment and Plan: Problem List Items Addressed This Visit    None    Visit Diagnoses    Plantar fascial fibromatosis of left foot    -  Primary   Plantar fasciitis of left foot       Relevant Orders   DG Foot Complete Left   Arch pain of left foot          -Complete examination performed.  -Xrays reviewed -Discussed with patient in detail the  condition of plantar fasciitis/fibroma, how this occurs and general treatment options. Explained both conservative and surgical treatments.  -Rx Verapmil to use topical  -Recommended good supportive shoes and advised new custom insoles; patient to see Liliane Channel or Benjie Karvonen for new orthotics -Explained and dispensed to patient daily stretching exercises. -Recommend patient to ice affected area 1-2x daily. -Patient to return to office for orthotics or sooner if problems or questions arise.  Landis Martins, DPM

## 2020-01-17 NOTE — Telephone Encounter (Signed)
Pt was seen in office today and is having a prescription sent in but noticed it was the wrong pharmacy on her paperwork.  Please send the prescription to [facility] on [address]

## 2020-02-06 ENCOUNTER — Other Ambulatory Visit: Admitting: Orthotics

## 2020-02-06 ENCOUNTER — Ambulatory Visit: Payer: Self-pay

## 2020-02-06 NOTE — Telephone Encounter (Signed)
Patient called stating that she feels she has been exposed to COVID-19 positive patient.  She states that her sisters son just died in hospital with COVID-19. Her sister was caring for him prior to his hospitalization. He sister immune system is weakened from recent cancer treatment. He sister as of now has no symptoms but she was told she should quarantine 14 days. Patient visited her sister today because of the lose. Patient was advised to quarantine for 14 days and monitor for symptoms.  She can test after 5-7 day or if she develops symptoms. She verbalized understanding of all information.  Reason for Disposition . Health Information question, no triage required and triager able to answer question  Answer Assessment - Initial Assessment Questions 1. REASON FOR CALL or QUESTION: "What is your reason for calling today?" or "How can I best help you?" or "What question do you have that I can help answer?"     I've exposed to COVID-19 positive patient. What do I do next  Protocols used: [place]

## 2020-03-14 ENCOUNTER — Encounter: Payer: Self-pay | Admitting: Gastroenterology

## 2020-04-18 ENCOUNTER — Ambulatory Visit: Admitting: Gastroenterology

## 2020-04-21 ENCOUNTER — Encounter (HOSPITAL_COMMUNITY): Payer: Self-pay | Admitting: Family Medicine

## 2020-04-21 ENCOUNTER — Ambulatory Visit (HOSPITAL_COMMUNITY)
Admission: EM | Admit: 2020-04-21 | Discharge: 2020-04-21 | Disposition: A | Discharge status: Home / Self Care | Attending: Family Medicine | Admitting: Family Medicine

## 2020-04-21 ENCOUNTER — Ambulatory Visit (INDEPENDENT_AMBULATORY_CARE_PROVIDER_SITE_OTHER)

## 2020-04-21 ENCOUNTER — Other Ambulatory Visit: Payer: Self-pay

## 2020-04-21 DIAGNOSIS — J069 Acute upper respiratory infection, unspecified: Secondary | ICD-10-CM | POA: Diagnosis not present

## 2020-04-21 DIAGNOSIS — Z20822 Contact with and (suspected) exposure to covid-19: Secondary | ICD-10-CM | POA: Insufficient documentation

## 2020-04-21 DIAGNOSIS — I10 Essential (primary) hypertension: Secondary | ICD-10-CM | POA: Diagnosis not present

## 2020-04-21 DIAGNOSIS — Z791 Long term (current) use of non-steroidal anti-inflammatories (NSAID): Secondary | ICD-10-CM | POA: Diagnosis not present

## 2020-04-21 DIAGNOSIS — R7303 Prediabetes: Secondary | ICD-10-CM | POA: Insufficient documentation

## 2020-04-21 DIAGNOSIS — Z87891 Personal history of nicotine dependence: Secondary | ICD-10-CM | POA: Insufficient documentation

## 2020-04-21 DIAGNOSIS — R0602 Shortness of breath: Secondary | ICD-10-CM | POA: Diagnosis not present

## 2020-04-21 DIAGNOSIS — Z7901 Long term (current) use of anticoagulants: Secondary | ICD-10-CM | POA: Diagnosis not present

## 2020-04-21 DIAGNOSIS — R05 Cough: Secondary | ICD-10-CM

## 2020-04-21 DIAGNOSIS — J029 Acute pharyngitis, unspecified: Secondary | ICD-10-CM | POA: Diagnosis not present

## 2020-04-21 DIAGNOSIS — Z79899 Other long term (current) drug therapy: Secondary | ICD-10-CM | POA: Insufficient documentation

## 2020-04-21 LAB — SARS CORONAVIRUS 2 (TAT 6-24 HRS): SARS Coronavirus 2: NEGATIVE

## 2020-04-21 NOTE — Discharge Instructions (Addendum)
Your x ray looked good No pneumonia Recommend  mucinex OTC Follow up as needed for continued or worsening symptoms

## 2020-04-21 NOTE — ED Triage Notes (Signed)
Pt c/o productive cough with clear sputum, congestion, sneezing, runny nose, sore throat, since Wednesday.  Denies abdom pain, n/v/d, fever, chills. Denies SOB at present but states she did feel SOB once the past two days.   States she took Mucinex last night and has improvement of symptoms, but awoke this morning with return of all symptoms states. No tylenol or NSAIDs today. Bilat lung sounds CTA.  Pt recently had her synthroid reduced from 117mcg to 63mcg 2/2 levels/weight loss.

## 2020-04-22 NOTE — ED Provider Notes (Signed)
[facility]    CSN: [id number] Arrival date & time: 04/21/20  1152      History   Chief Complaint Chief Complaint  Patient presents with   Cough    HPI Brenda Reyes is a 64 y.o. female.   Patient is a 64 year old female who presents today with productive cough with clear sputum, congestion, sneezing, runny nose and sore throat since Wednesday.  Denies any shortness of breath currently but has been coming and going for the last 2 days.  Took Mucinex last night with improvement of symptoms.  No medications today.  No associated nausea, vomiting, diarrhea, fever, chills, sore throat or ear pain. No fever.  ROS per HPI      Past Medical History:  Diagnosis Date   Borderline diabetes    Colon polyps 12/27/2009   Tubular adenomatous polyps   Diverticulosis    Graves disease 02/12/15   to start treatment after colonoscopiy   Hypertension    Hyperthyroidism    Internal hemorrhoids    SBO (small bowel obstruction) ([place]) 05/2017    Patient Active Problem List   Diagnosis Date Noted   SBO (small bowel obstruction) ([place]) 04/13/2013   TOBACCO USER 11/15/2009   URI 11/15/2009   FIBROIDS, UTERUS 04/19/2007   ANEMIA, IRON DEFICIENCY NOS 04/19/2007   ABUSE, ALCOHOL, UNSPECIFIED 04/19/2007   HYPERTENSION, ESSENTIAL NOS 04/19/2007   Hemorrhoids 04/19/2007   HYSTERECTOMY, HX OF 09/10/2006    Past Surgical History:  Procedure Laterality Date   ABDOMINAL HYSTERECTOMY     HEMORRHOID SURGERY     RADIOACTIVE TYHROID IODINE     TUBAL LIGATION      OB History   No obstetric history on file.      Home Medications    Prior to Admission medications   Medication Sig Start Date End Date Taking? Authorizing Provider  amLODipine (NORVASC) 10 MG tablet Take 10 mg by mouth daily. 11/29/12  Yes Johnson, Clanford L, MD  Calcium Carbonate-Vitamin D (CALTRATE 600+D) 600-400 MG-UNIT per tablet Take 1 tablet by mouth daily.   Yes [provider]   levothyroxine (SYNTHROID, LEVOTHROID) 88 MCG tablet Take 88 mcg by mouth daily before breakfast.   Yes [provider]  lisinopril-hydrochlorothiazide (PRINZIDE,ZESTORETIC) 20-12.5 MG per tablet Take 2 tablets by mouth daily. 11/29/12  Yes Johnson, Clanford L, MD  metoprolol (TOPROL-XL) 200 MG 24 hr tablet Take 200 mg by mouth daily.   Yes [provider]  potassium chloride (K-DUR) 10 MEQ tablet Take 10 mEq by mouth once.  01/14/13  Yes Caren Griffins, MD  meloxicam (MOBIC) 15 MG tablet Take 15 mg by mouth daily as needed for pain.    [provider]  NON FORMULARY [facility]  Scar Cream:  Verapamil 10%; Pentoxifylline 5% Apply 1 to 2 gm to affected areas 3-4 times daily; 100 gm  Refills are as needed  Faxed to [place] on 01/17/20    Landis Martins, DPM  cloNIDine (CATAPRES) 0.2 MG tablet Take 0.2 mg by mouth at bedtime as needed (bllod pressure).  09/15/12 04/21/20  Rosana Hoes, MD    Family History Family History  Problem Relation Age of Onset   Throat cancer Father    Cancer - Other Mother        blood   Colon cancer Neg Hx    Stomach cancer Neg Hx     Social History Social History   Tobacco Use   Smoking status: Former Smoker  Packs/day: 0.25    Types: Cigarettes    Quit date: 05/05/2017    Years since quitting: 2.9   Smokeless tobacco: Never Used   Tobacco comment: no cigarattes in 7 days per pt.   Vaping Use   Vaping Use: Never used  Substance Use Topics   Alcohol use: Yes    Alcohol/week: 0.0 standard drinks    Comment: not drinking per patient, none in "7 days", in "AA" per pt.   Drug use: No     Allergies   Triamterene   Review of Systems Review of Systems   Physical Exam Triage Vital Signs ED Triage Vitals  Enc Vitals Group     BP 04/21/20 1300 115/74     Pulse Rate 04/21/20 1300 60     Resp 04/21/20 1300 18     Temp 04/21/20 1300 97.8 F (36.6 C)     Temp Source 04/21/20 1300 Oral      SpO2 04/21/20 1300 99 %     Weight --      Height --      Head Circumference --      Peak Flow --      Pain Score 04/21/20 1301 0     Pain Loc --      Pain Edu? --      Excl. in [place]? --    No data found.  Updated Vital Signs BP 115/74 (BP Location: Left Arm)    Pulse 60    Temp 97.8 F (36.6 C) (Oral)    Resp 18    SpO2 99%   Visual Acuity Right Eye Distance:   Left Eye Distance:   Bilateral Distance:    Right Eye Near:   Left Eye Near:    Bilateral Near:     Physical Exam Vitals and nursing note reviewed.  Constitutional:      General: She is not in acute distress.    Appearance: Normal appearance. She is not ill-appearing, toxic-appearing or diaphoretic.  HENT:     Head: Normocephalic.     Right Ear: Tympanic membrane and ear canal normal.     Left Ear: Tympanic membrane and ear canal normal.     Nose: Nose normal.     Mouth/Throat:     Pharynx: Oropharynx is clear.  Eyes:     Conjunctiva/sclera: Conjunctivae normal.  Cardiovascular:     Rate and Rhythm: Normal rate and regular rhythm.  Pulmonary:     Effort: Pulmonary effort is normal.     Breath sounds: Normal breath sounds.  Musculoskeletal:        General: Normal range of motion.     Cervical back: Normal range of motion.  Skin:    General: Skin is warm and dry.     Findings: No rash.  Neurological:     Mental Status: She is alert.  Psychiatric:        Mood and Affect: Mood normal.      UC Treatments / Results  Labs (all labs ordered are listed, but only abnormal results are displayed) Labs Reviewed  SARS CORONAVIRUS 2 (TAT 6-24 HRS)    EKG   Radiology DG Chest 2 View  Result Date: 04/21/2020 CLINICAL DATA:  Cough and shortness of breath EXAM: CHEST - 2 VIEW COMPARISON:  February 20, 2012 FINDINGS: Lungs are clear. Heart size and pulmonary vascularity are normal. No adenopathy. No bone lesions. IMPRESSION: Lungs clear.  Cardiac silhouette normal. Electronically Signed   By: Lowella Grip  III M.D.   On: 04/21/2020 13:57    Procedures Procedures (including critical care time)  Medications Ordered in UC Medications - No data to display  Initial Impression / Assessment and Plan / UC Course  I have reviewed the triage vital signs and the nursing notes.  Pertinent labs & imaging results that were available during my care of the patient were reviewed by me and considered in my medical decision making (see chart for details).     Viral URI X-ray normal Recommend over-the-counter Mucinex for symptoms Follow up as needed for continued or worsening symptoms  Final Clinical Impressions(s) / UC Diagnoses   Final diagnoses:  Viral URI with cough     Discharge Instructions     Your x ray looked good No pneumonia Recommend  mucinex OTC Follow up as needed for continued or worsening symptoms     ED Prescriptions    None     PDMP not reviewed this encounter.   Orvan July, NP 04/22/20 1054

## 2020-05-11 ENCOUNTER — Ambulatory Visit: Admitting: Nurse Practitioner

## 2020-07-13 ENCOUNTER — Ambulatory Visit (HOSPITAL_COMMUNITY)
Admission: EM | Admit: 2020-07-13 | Discharge: 2020-07-13 | Disposition: A | Discharge status: Home / Self Care | Attending: Internal Medicine | Admitting: Internal Medicine

## 2020-07-13 ENCOUNTER — Encounter (HOSPITAL_COMMUNITY): Payer: Self-pay | Admitting: Emergency Medicine

## 2020-07-13 ENCOUNTER — Other Ambulatory Visit: Payer: Self-pay

## 2020-07-13 DIAGNOSIS — M7918 Myalgia, other site: Secondary | ICD-10-CM | POA: Insufficient documentation

## 2020-07-13 LAB — VITAMIN D 25 HYDROXY (VIT D DEFICIENCY, FRACTURES): Vit D, 25-Hydroxy: 42.1 ng/mL (ref 30–100)

## 2020-07-13 NOTE — ED Triage Notes (Signed)
Pt presents to [facility] for assessment after she was running trying to catch a train on Monday night and her legs gave out and she fell back on her buttocks.  Patient states she was unable to get up for a while right after.  C/o tightness to groin since fall.  C/o shooting pains x 5 months to all extremities.

## 2020-07-13 NOTE — ED Provider Notes (Signed)
[place]    CSN: [id number] Arrival date & time: 07/13/20  1316      History   Chief Complaint Chief Complaint  Patient presents with  . Fall    HPI Brenda Reyes is a 64 y.o. female comes to the urgent care today after she had a mechanical fall 5 days ago.  Patient was trying to catch a train, her legs gave out and she fell.  She denies hitting her head.  Patient apparently fell on her buttocks.  Patient denies any pain in the buttocks region at this time.  He however says that over the past several months she has had weakness in the thighs especially when she is climbing up a flight of stairs, sharp shooting pain in the arms and legs as well.  She denies any numbness or tingling.  No headaches, double vision or blurry vision.  No alcohol use.  No rash.   HPI  Past Medical History:  Diagnosis Date  . Borderline diabetes   . Colon polyps 12/27/2009   Tubular adenomatous polyps  . Diverticulosis   . Graves disease 02/12/15   to start treatment after colonoscopiy  . Hypertension   . Hyperthyroidism   . Internal hemorrhoids   . SBO (small bowel obstruction) ([place]) 05/2017    Patient Active Problem List   Diagnosis Date Noted  . SBO (small bowel obstruction) ([place]) 04/13/2013  . TOBACCO USER 11/15/2009  . URI 11/15/2009  . FIBROIDS, UTERUS 04/19/2007  . ANEMIA, IRON DEFICIENCY NOS 04/19/2007  . ABUSE, ALCOHOL, UNSPECIFIED 04/19/2007  . HYPERTENSION, ESSENTIAL NOS 04/19/2007  . Hemorrhoids 04/19/2007  . HYSTERECTOMY, HX OF 09/10/2006    Past Surgical History:  Procedure Laterality Date  . ABDOMINAL HYSTERECTOMY    . HEMORRHOID SURGERY    . RADIOACTIVE TYHROID IODINE    . TUBAL LIGATION      OB History   No obstetric history on file.      Home Medications    Prior to Admission medications   Medication Sig Start Date End Date Taking? Authorizing Provider  amLODipine (NORVASC) 10 MG tablet Take 10 mg by mouth daily. 11/29/12   Johnson, Clanford L,  MD  Calcium Carbonate-Vitamin D (CALTRATE 600+D) 600-400 MG-UNIT per tablet Take 1 tablet by mouth daily.    [provider]  levothyroxine (SYNTHROID, LEVOTHROID) 88 MCG tablet Take 88 mcg by mouth daily before breakfast.    [provider]  lisinopril-hydrochlorothiazide (PRINZIDE,ZESTORETIC) 20-12.5 MG per tablet Take 2 tablets by mouth daily. 11/29/12   Johnson, Clanford L, MD  meloxicam (MOBIC) 15 MG tablet Take 15 mg by mouth daily as needed for pain.    [provider]  metoprolol (TOPROL-XL) 200 MG 24 hr tablet Take 200 mg by mouth daily.    [provider]  NON FORMULARY [facility]  Scar Cream:  Verapamil 10%; Pentoxifylline 5% Apply 1 to 2 gm to affected areas 3-4 times daily; 100 gm  Refills are as needed  Faxed to [place] on 01/17/20    Landis Martins, DPM  potassium chloride (K-DUR) 10 MEQ tablet Take 10 mEq by mouth once.  01/14/13   Caren Griffins, MD  cloNIDine (CATAPRES) 0.2 MG tablet Take 0.2 mg by mouth at bedtime as needed (bllod pressure).  09/15/12 04/21/20  Rosana Hoes, MD    Family History Family History  Problem Relation Age of Onset  . Throat cancer Father   . Cancer - Other Mother  blood  . Colon cancer Neg Hx   . Stomach cancer Neg Hx     Social History Social History   Tobacco Use  . Smoking status: Former Smoker    Packs/day: 0.25    Types: Cigarettes    Quit date: 05/05/2017    Years since quitting: 3.1  . Smokeless tobacco: Never Used  . Tobacco comment: no cigarattes in 7 days per pt.   Vaping Use  . Vaping Use: Never used  Substance Use Topics  . Alcohol use: Yes    Alcohol/week: 0.0 standard drinks    Comment: not drinking per patient, none in "7 days", in "AA" per pt.  . Drug use: No     Allergies   Triamterene   Review of Systems Review of Systems  Constitutional: Negative.   HENT: Negative.   Gastrointestinal: Negative.  Negative for diarrhea, nausea and vomiting.   Musculoskeletal: Positive for myalgias. Negative for arthralgias and gait problem.  Skin: Negative.  Negative for rash.  Neurological: Negative for dizziness, weakness, light-headedness and headaches.     Physical Exam Triage Vital Signs ED Triage Vitals  Enc Vitals Group     BP 07/13/20 1438 116/72     Pulse Rate 07/13/20 1438 60     Resp 07/13/20 1438 16     Temp 07/13/20 1438 98.1 F (36.7 C)     Temp Source 07/13/20 1438 Oral     SpO2 07/13/20 1438 100 %     Weight --      Height --      Head Circumference --      Peak Flow --      Pain Score 07/13/20 1542 5     Pain Loc --      Pain Edu? --      Excl. in [place]? --    No data found.  Updated Vital Signs BP 116/72 (BP Location: Left Arm)   Pulse 60   Temp 98.1 F (36.7 C) (Oral)   Resp 16   SpO2 100%   Visual Acuity Right Eye Distance:   Left Eye Distance:   Bilateral Distance:    Right Eye Near:   Left Eye Near:    Bilateral Near:     Physical Exam   UC Treatments / Results  Labs (all labs ordered are listed, but only abnormal results are displayed) Labs Reviewed  VITAMIN D 25 HYDROXY (VIT D DEFICIENCY, FRACTURES)    EKG   Radiology No results found.  Procedures Procedures (including critical care time)  Medications Ordered in UC Medications - No data to display  Initial Impression / Assessment and Plan / UC Course  I have reviewed the triage vital signs and the nursing notes.  Pertinent labs & imaging results that were available during my care of the patient were reviewed by me and considered in my medical decision making (see chart for details).     1.  Proximal muscle weakness and pain: Vitamin D level Patient is advised to follow-up with primary care physician for further work-up.  She may have proximal myopathy and will eventually need to be evaluated by rheumatologist if the symptoms persist.  If vitamin D levels are low, vitamin D supplementation will be done. Final Clinical  Impressions(s) / UC Diagnoses   Final diagnoses:  Myalgia, other site     Discharge Instructions     Tylenol as needed for pain If symptoms are persistent, please follow-up with your primary care physician for further work-up.  ED Prescriptions    None     PDMP not reviewed this encounter.   Chase Picket, MD 07/13/20 1556

## 2020-07-13 NOTE — Discharge Instructions (Signed)
Tylenol as needed for pain If symptoms are persistent, please follow-up with your primary care physician for further work-up.

## 2020-11-29 ENCOUNTER — Encounter: Payer: Self-pay | Admitting: Sports Medicine

## 2020-11-29 ENCOUNTER — Other Ambulatory Visit: Payer: Self-pay

## 2020-11-29 ENCOUNTER — Ambulatory Visit (INDEPENDENT_AMBULATORY_CARE_PROVIDER_SITE_OTHER): Admitting: Sports Medicine

## 2020-11-29 DIAGNOSIS — M79672 Pain in left foot: Secondary | ICD-10-CM | POA: Diagnosis not present

## 2020-11-29 DIAGNOSIS — M722 Plantar fascial fibromatosis: Secondary | ICD-10-CM | POA: Diagnosis not present

## 2020-11-29 NOTE — Progress Notes (Signed)
Subjective: Brenda Reyes is a 65 y.o. female patient returns  to office with complaint of moderate arch pain and 2 knots when she works now at [employer] and feels like her feet are killing her and the "knots" are getting bigger. No other issues noted.  Patient Active Problem List   Diagnosis Date Noted  . SBO (small bowel obstruction) ([place]) 04/13/2013  . TOBACCO USER 11/15/2009  . URI 11/15/2009  . FIBROIDS, UTERUS 04/19/2007  . ANEMIA, IRON DEFICIENCY NOS 04/19/2007  . ABUSE, ALCOHOL, UNSPECIFIED 04/19/2007  . HYPERTENSION, ESSENTIAL NOS 04/19/2007  . Hemorrhoids 04/19/2007  . HYSTERECTOMY, HX OF 09/10/2006    Current Outpatient Medications on File Prior to Visit  Medication Sig Dispense Refill  . amLODipine (NORVASC) 10 MG tablet Take 10 mg by mouth daily.    . Calcium Carbonate-Vitamin D 600-400 MG-UNIT tablet Take 1 tablet by mouth daily.    . diclofenac Sodium (VOLTAREN) 1 % GEL Apply topically.    . latanoprost (XALATAN) 0.005 % ophthalmic solution 1 drop at bedtime.    Marland Kitchen levothyroxine (SYNTHROID, LEVOTHROID) 88 MCG tablet Take 88 mcg by mouth daily before breakfast.    . lisinopril-hydrochlorothiazide (PRINZIDE,ZESTORETIC) 20-12.5 MG per tablet Take 2 tablets by mouth daily.    . meloxicam (MOBIC) 15 MG tablet Take 15 mg by mouth daily as needed for pain.    . metoprolol (TOPROL-XL) 200 MG 24 hr tablet Take 200 mg by mouth daily.    . NON FORMULARY [facility]  Scar Cream:  Verapamil 10%; Pentoxifylline 5% Apply 1 to 2 gm to affected areas 3-4 times daily; 100 gm  Refills are as needed  Faxed to [place] on 01/17/20    . potassium chloride (K-DUR) 10 MEQ tablet Take 10 mEq by mouth once.     . [DISCONTINUED] cloNIDine (CATAPRES) 0.2 MG tablet Take 0.2 mg by mouth at bedtime as needed (bllod pressure).      No current facility-administered medications on file prior to visit.    Allergies  Allergen Reactions  . Triamterene Other (See Comments)     Dizziness     Objective: Physical Exam General: The patient is alert and oriented x3 in no acute distress.  Dermatology: Skin is warm, dry and supple bilateral lower extremities. Nails 1-10 are normal. There is no erythema, edema, no eccymosis, no open lesions present. 2 raised masses in arch larger one measures 1.5cm and smaller/distal one measures 1cm consistent with plantar fibromas on left that is larger than previous visit. Integument is otherwise unremarkable.  Vascular: Dorsalis Pedis pulse and Posterior Tibial pulse are 2/4 bilateral. Capillary fill time is immediate to all digits.  Neurological: Grossly intact to light touch bilateral.   Musculoskeletal: Tenderness to palpation at the medial arch on left with bow string plantar fascia. Asymptomatic bunion and pes planus bilateral. Strength 5/5 in all groups bilateral.   Assessment and Plan: Problem List Items Addressed This Visit   None   Visit Diagnoses    Plantar fascial fibromatosis of left foot    -  Primary   Arch pain of left foot       Plantar fasciitis of left foot         -Complete examination performed.  -Re-Discussed with patient in detail the condition of plantar fasciitis/fibroma, how this occurs and general treatment options. Explained both conservative and surgical treatments.  -Patient declines surgery at this time -Rx PT for treatment for fibromas and plantar fasciitis  -Recommended good supportive shoes and  advised new OTC airplus insoles to use in work shoes -Patient to return to office after PT or sooner if problems or questions arise.  Landis Martins, DPM

## 2020-11-29 NOTE — Patient Instructions (Signed)
Airplus insoles from [employer] 

## 2020-12-24 ENCOUNTER — Encounter: Payer: Self-pay | Admitting: Gastroenterology

## 2021-01-23 ENCOUNTER — Ambulatory Visit: Admitting: Gastroenterology

## 2021-08-12 ENCOUNTER — Encounter: Payer: Self-pay | Admitting: Gastroenterology

## 2021-09-19 ENCOUNTER — Ambulatory Visit (INDEPENDENT_AMBULATORY_CARE_PROVIDER_SITE_OTHER): Admitting: Sports Medicine

## 2021-09-19 ENCOUNTER — Other Ambulatory Visit: Payer: Self-pay

## 2021-09-19 DIAGNOSIS — M216X1 Other acquired deformities of right foot: Secondary | ICD-10-CM

## 2021-09-19 DIAGNOSIS — M79671 Pain in right foot: Secondary | ICD-10-CM

## 2021-09-19 DIAGNOSIS — M79672 Pain in left foot: Secondary | ICD-10-CM

## 2021-09-19 DIAGNOSIS — M216X2 Other acquired deformities of left foot: Secondary | ICD-10-CM

## 2021-09-19 DIAGNOSIS — M722 Plantar fascial fibromatosis: Secondary | ICD-10-CM | POA: Diagnosis not present

## 2021-09-19 MED ORDER — DICLOFENAC EPOLAMINE 1.3 % EX PTCH: 1.0000 | MEDICATED_PATCH | Freq: Two times a day (BID) | CUTANEOUS | 0 refills | Status: DC

## 2021-09-19 NOTE — Progress Notes (Signed)
Subjective: Brenda Reyes is a 65 y.o. female patient returns to office with complaint of continued arch pain and Knots at the arches and states that she is concerned that it may be getting bigger and states that she never went to physical therapy from before because she is always working and states that she drinks beer is not sure if this is contributing. No other issues noted.  Patient Active Problem List   Diagnosis Date Noted   SBO (small bowel obstruction) ([place]) 04/13/2013   TOBACCO USER 11/15/2009   URI 11/15/2009   FIBROIDS, UTERUS 04/19/2007   ANEMIA, IRON DEFICIENCY NOS 04/19/2007   ABUSE, ALCOHOL, UNSPECIFIED 04/19/2007   HYPERTENSION, ESSENTIAL NOS 04/19/2007   Hemorrhoids 04/19/2007   HYSTERECTOMY, HX OF 09/10/2006    Current Outpatient Medications on File Prior to Visit  Medication Sig Dispense Refill   amLODipine (NORVASC) 10 MG tablet Take 10 mg by mouth daily.     Calcium Carbonate-Vitamin D 600-400 MG-UNIT tablet Take 1 tablet by mouth daily.     diclofenac Sodium (VOLTAREN) 1 % GEL Apply topically.     latanoprost (XALATAN) 0.005 % ophthalmic solution 1 drop at bedtime.     levothyroxine (SYNTHROID, LEVOTHROID) 88 MCG tablet Take 88 mcg by mouth daily before breakfast.     lisinopril-hydrochlorothiazide (PRINZIDE,ZESTORETIC) 20-12.5 MG per tablet Take 2 tablets by mouth daily.     meloxicam (MOBIC) 15 MG tablet Take 15 mg by mouth daily as needed for pain.     metoprolol (TOPROL-XL) 200 MG 24 hr tablet Take 200 mg by mouth daily.     NON FORMULARY [facility]  Scar Cream:  Verapamil 10%; Pentoxifylline 5% Apply 1 to 2 gm to affected areas 3-4 times daily; 100 gm  Refills are as needed  Faxed to [place] on 01/17/20     potassium chloride (K-DUR) 10 MEQ tablet Take 10 mEq by mouth once.      [DISCONTINUED] cloNIDine (CATAPRES) 0.2 MG tablet Take 0.2 mg by mouth at bedtime as needed (bllod pressure).      No current facility-administered  medications on file prior to visit.    Allergies  Allergen Reactions   Triamterene Other (See Comments)    Dizziness     Objective: Physical Exam General: The patient is alert and oriented x3 in no acute distress.  Dermatology: Skin is warm, dry and supple bilateral lower extremities. Nails 1-10 are normal. There is no erythema, edema, no eccymosis, no open lesions present. 2 raised masses in arch larger one measures 1.5cm and smaller/distal one measures 1cm consistent with plantar fibromas on left that is same as previous with also small plantar fibromas in the right arch as well .Integument is otherwise unremarkable.  Vascular: Dorsalis Pedis pulse and Posterior Tibial pulse are 2/4 bilateral. Capillary fill time is immediate to all digits.  Neurological: Grossly intact to light touch bilateral.   Musculoskeletal: Tenderness to palpation at the medial arch on left>right with bow string plantar fascia. Asymptomatic bunion and pes planus bilateral. Strength 5/5 in all groups bilateral.   Assessment and Plan: Problem List Items Addressed This Visit   None Visit Diagnoses     Plantar fascial fibromatosis of left foot    -  Primary   Arch pain of left foot       Arch pain, right       Plantar fascial fibromatosis of right foot       Equinus deformity of both feet          -  Complete examination performed.  -Re-Discussed with patient in detail the condition of plantar fasciitis/fibroma, how this occurs and general treatment options. Explained both conservative and surgical treatments.  -Rx night splint -Rx Flector patches for patient to use as instructed -Recommended good supportive shoes; Shoe list provided and continue with air plus insoles -Advised patient surgery for these may also still cause pain and a high rate of recurrence -Patient to return to office in 2 months or sooner if problems or questions arise.  Landis Martins, DPM

## 2021-09-19 NOTE — Patient Instructions (Signed)
Shoe List For tennis shoes recommend:  [place] New balance [place] HOKA Can be purchased at [employer] sports or [employer] arch fit Can be purchased at any major retailers  Vionic  SAS Can be purchased at [employer] or [employer]   For work shoes recommend: [employer] Work [employer] Can be purchased at a variety of places or [employer]   For casual shoes recommend: Oofos Can be purchased at [employer] sports or [employer]  Can be purchased at [employer] or [place] recommend: Power Steps Can be purchased in office/[facility] [employer] Can be purchased at [employer] sports or [employer] Can be purchased at [place] recommend: Leisure centre manager at [employer] and [employer]

## 2021-09-21 ENCOUNTER — Other Ambulatory Visit: Payer: Self-pay | Admitting: Sports Medicine

## 2021-09-23 ENCOUNTER — Ambulatory Visit (INDEPENDENT_AMBULATORY_CARE_PROVIDER_SITE_OTHER): Admitting: Gastroenterology

## 2021-09-23 ENCOUNTER — Encounter: Payer: Self-pay | Admitting: Gastroenterology

## 2021-09-23 VITALS — BP 112/62 | HR 78 | Ht 66.5 in | Wt 137.0 lb

## 2021-09-23 DIAGNOSIS — Z1211 Encounter for screening for malignant neoplasm of colon: Secondary | ICD-10-CM | POA: Diagnosis not present

## 2021-09-23 DIAGNOSIS — Z789 Other specified health status: Secondary | ICD-10-CM

## 2021-09-23 DIAGNOSIS — Z8719 Personal history of other diseases of the digestive system: Secondary | ICD-10-CM

## 2021-09-23 DIAGNOSIS — K648 Other hemorrhoids: Secondary | ICD-10-CM

## 2021-09-23 NOTE — Patient Instructions (Addendum)
If you are age 63 or older, your body mass index should be between 23-30. Your Body mass index is 21.78 kg/m. If this is out of the aforementioned range listed, please consider follow up with your Primary Care Provider.  If you are age 41 or younger, your body mass index should be between 19-25. Your Body mass index is 21.78 kg/m. If this is out of the aformentioned range listed, please consider follow up with your Primary Care Provider.   ________________________________________________________  The [facility] providers would like to encourage you to use [facility] to communicate with providers for non-urgent requests or questions.  Due to long hold times on the telephone, sending your provider a message by [facility] may be a faster and more efficient way to get a response.  Please allow 48 business hours for a response.  Please remember that this is for non-urgent requests.  _______________________________________________________  We will request your records from Dr. Criss Rosales. Please decrease your alcohol use.  You will be due for a recall colonoscopy in April 2026. We will send you a reminder in the mail when it gets closer to that time.

## 2021-09-23 NOTE — Progress Notes (Signed)
HPI :  65 year old female with a history of internal hemorrhoids, history of small bowel obstruction, history of alcohol use, referred by Dr. Lucianne Lei for these issues.  The patient was previously followed by our office by Dr. Deatra Ina until 2016.  She has not been seen since that time.  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  She states she was admitted in 2014 and then again appear to have recurrence in 2018.  At baseline she denies any abdominal pains that bother her.  She has not had any recurrent obstructions for the past 4 to 5 years per her report.  She has had very rare episode of mid abdominal discomfort that is quite transient and relieved very quickly with using ginger.  She states this is uncommon in general has no symptoms of bother her in her abdomen.  She has mild constipation for which she eats prunes/raisins or other dietary measures to treat.  She does not like taking laxatives or medications to treat her bowels.  At baseline she denies any constipation that bothers her on a routine basis, this is only occasional.  She had previously endorsed symptomatic hemorrhoids in 2016 when she saw Dr. Deatra Ina.  He performed hemorrhoid banding of the right anterior and right posterior hemorrhoid in 2016.  He left the practice after that time and she did not follow through with the final banding.  She states the 2 banding's provided adequate relief and her hemorrhoids have not really bother her too much.  She states she had a very scant amount of red blood noted on the toilet paper when wiping herself earlier this month, had 2 episodes of it.  Outside of that she does not see any blood in the stool or the toilet.  She generally denies any blood at all bowels in recent years.  Hemorrhoids do not really bother her at all.  She does have a history of alcohol abuse.  She drinks roughly 4 Coors lights per day, states she has drank a bit more than  usual after her mother passed in 2013.  She states she has been drinking for a long time.  No known history of cirrhosis or liver disease that she endorses.  She states she is seeing a therapist for this issue.  She is very functional and works 2 jobs, 1 of which at [employer].  She states she has never missed work and generally feels well.  She has rare tobacco use.  She denies any routine use of NSAIDs, uses meloxicam rarely for knee pain while at work.  There are no recent labs on file for her in our system.  She states Dr. Criss Rosales has checked her labs frequently, most recent labs listed below from September. LFTs normal, platelets normal. Hgb and MCV normal.   Generally she is quite happy with how she is feeling at baseline and denies much complaints today.  Her last colonoscopy was in 2016 with Dr. Deatra Ina which was normal other than internal hemorrhoids and diverticulosis.  He recommended a repeat exam in 10 years at that time.   Prior GI workup: - underwent 2 hemorrhoid bandings 2016 per Dr. Deatra Ina - RA and RP area  Colonoscopy 02/14/2015 - diverticulosis, internal hemorrhoids - Dr. Deatra Ina - repeat in 10 years  Colonoscopy 12/25/2009 - small splenic flexure polyp, diverticulosis, hemorrhoids - path c/w TA  CT scan 05/2017 - IMPRESSION: Developing small bowel obstruction with transition at  a segment of thick wall small bowel loop in the left upper pelvis. Normal appendix.  Moderate bowel content in the colon.    Past Medical History:  Diagnosis Date   Borderline diabetes    Colon polyps 12/27/2009   Tubular adenomatous polyps   Diverticulosis    Graves disease 02/12/15   to start treatment after colonoscopiy   Hypertension    Hyperthyroidism    Internal hemorrhoids    SBO (small bowel obstruction) ([place]) 05/2017     Past Surgical History:  Procedure Laterality Date   ABDOMINAL HYSTERECTOMY     HEMORRHOID SURGERY     RADIOACTIVE TYHROID IODINE     TUBAL LIGATION     Family History   Problem Relation Age of Onset   Throat cancer Father    Cancer - Other Mother        blood   Colon cancer Neg Hx    Stomach cancer Neg Hx    Social History   Tobacco Use   Smoking status: Former    Packs/day: 0.25    Types: Cigarettes    Quit date: 05/05/2017    Years since quitting: 4.3   Smokeless tobacco: Never   Tobacco comments:    no cigarattes in 7 days per pt.   Vaping Use   Vaping Use: Never used  Substance Use Topics   Alcohol use: Yes    Alcohol/week: 0.0 standard drinks    Comment: not drinking per patient, none in "7 days", in "AA" per pt.   Drug use: No   Current Outpatient Medications  Medication Sig Dispense Refill   amLODipine (NORVASC) 10 MG tablet Take 10 mg by mouth daily.     Calcium Carbonate-Vitamin D 600-400 MG-UNIT tablet Take 1 tablet by mouth daily.     diclofenac (FLECTOR) 1.3 % PTCH Place 1 patch onto the skin 2 (two) times daily. 60 patch 0   diclofenac Sodium (VOLTAREN) 1 % GEL Apply topically.     latanoprost (XALATAN) 0.005 % ophthalmic solution 1 drop at bedtime.     levothyroxine (SYNTHROID, LEVOTHROID) 88 MCG tablet Take 88 mcg by mouth daily before breakfast.     lisinopril-hydrochlorothiazide (PRINZIDE,ZESTORETIC) 20-12.5 MG per tablet Take 2 tablets by mouth daily.     meloxicam (MOBIC) 15 MG tablet Take 15 mg by mouth daily as needed for pain.     metoprolol (TOPROL-XL) 200 MG 24 hr tablet Take 200 mg by mouth daily.     NON FORMULARY [facility]  Scar Cream:  Verapamil 10%; Pentoxifylline 5% Apply 1 to 2 gm to affected areas 3-4 times daily; 100 gm  Refills are as needed  Faxed to [facility] on 01/17/20     potassium chloride (K-DUR) 10 MEQ tablet Take 10 mEq by mouth once.      No current facility-administered medications for this visit.   Allergies  Allergen Reactions   Triamterene Other (See Comments)    Dizziness      Review of Systems: All systems reviewed and negative except where noted in HPI.     Labs obtained from Dr. Fransico Setters office: 07/25/2021: T bil 0.4, AP 57, AST 18, ALT 11 Hgb 13.1, WBC 4.0, plt 316, MCV 93.4 BUN 16, Cr 0.7   Physical Exam: BP 112/62   Pulse 78   Ht 5' 6.5" (1.689 m)   Wt 137 lb (62.1 kg)   BMI 21.78 kg/m  Constitutional: Pleasant,well-developed, female in no acute distress. HEENT: Normocephalic and atraumatic. Conjunctivae are normal.  No scleral icterus. Neck supple.  Cardiovascular: Normal rate, regular rhythm.  Pulmonary/chest: Effort normal and breath sounds normal.  Abdominal: Soft, nondistended, nontender. There are no masses palpable.  DRE / Anoscopy - no fissures, internal hemorrhoids noted on anoscopy - nothing inflamed or high risk stigmata for bleeding, no mass lesions or polyps Extremities: no edema Lymphadenopathy: No cervical adenopathy noted. Neurological: Alert and oriented to person place and time. Skin: Skin is warm and dry. No rashes noted. Psychiatric: Normal mood and affect. Behavior is normal.   ASSESSMENT AND PLAN: 65 year old female here to reestablish care for the following:  Internal hemorrhoids History of small bowel obstruction Alcohol use Colon cancer screening  Patient has a history of internal hemorrhoids in the setting of mild constipation.  Neither of these appear to be bothering her much at present, she is status post banding in 2016 which helped.  She had recent scant blood just noted on the toilet paper in the setting of straining without any other bleeding symptoms.  Anoscopy confirms internal hemorrhoids without other concerning pathology.  Her last colonoscopy in 2016 showed no polyps.  We discussed hemorrhoids and risk for recurrent bleeding.  Her symptoms are very consistent with that of hemorrhoidal bleeding, 2 discrete scant episodes and nothing since.  She will monitor for now and try to avoid constipation.  I do not feel that she needs a colonoscopy right now, I think okay to wait until 2026 when she  is due for routine screening, unless she has recurrence of bleeding that is more persistent or concerning.  Otherwise she had a history of a small bowel obstruction thought to be due to adhesive disease and has had no baseline abdominal symptoms, a few transient episodes of mild discomfort relieved with ginger and no persistent symptoms.  She will keep an eye on this and if any recurrence or persistent symptoms she will let us know.  We discussed her history of alcohol abuse over the years.  Counseled her that 2 or more drinks per day in a female can lead to alcoholic liver disease.  She has not had any evidence of cirrhosis on imaging over the years.  Recent labs reviewed.  Her LFTs and platelet count are normal, MCV and hemoglobin normal.  No lab evidence of cirrhosis.  She was counseled on the risks of long-term alcohol use however moving forward and recommend she reduce to less than 2 drinks per day if she can, and avoid drinking daily if possible.  She will continue to follow-up with her therapist for this issue.  She agreed with the plan, all questions answered.  Plan: - obtained labs from Dr. Criss Rosales after the patient left the office, as above - no abnormalities - discussed reducing / minimizing her alcohol use over time, at risk for cirrhosis but no evidence of cirrhosis at this time - treat constipation with dietary measures, minimize straining. Preparation H PRN for hemorrhoid therapy, symptoms mild and do not think she warrants banding at this time - due for repeat colonoscopy in 2026 for screening purposes. She will contact me if any recurrence of bleeding or problems. - no abdominal symptoms at baseline, will contact me with any symptoms of recurrent obstruction  Jolly Mango, MD [facility]  CC: Lucianne Lei, MD

## 2021-09-25 NOTE — Telephone Encounter (Signed)
Prescription request-diclofenac 1% ptch,please advise

## 2021-11-21 ENCOUNTER — Ambulatory Visit: Admitting: Sports Medicine

## 2022-04-03 ENCOUNTER — Ambulatory Visit: Admitting: Podiatry

## 2022-04-09 ENCOUNTER — Ambulatory Visit (INDEPENDENT_AMBULATORY_CARE_PROVIDER_SITE_OTHER)

## 2022-04-09 ENCOUNTER — Ambulatory Visit: Admitting: Podiatry

## 2022-04-09 DIAGNOSIS — M779 Enthesopathy, unspecified: Secondary | ICD-10-CM

## 2022-04-09 DIAGNOSIS — M21619 Bunion of unspecified foot: Secondary | ICD-10-CM | POA: Diagnosis not present

## 2022-04-09 DIAGNOSIS — M722 Plantar fascial fibromatosis: Secondary | ICD-10-CM | POA: Diagnosis not present

## 2022-04-09 NOTE — Progress Notes (Signed)
Subjective:   Patient ID: Brenda Reyes, female   DOB: 66 y.o.   MRN: [id number]   HPI Patient states she is developing a bunion that is becoming increasingly painful left foot and has nodules in her arches but they are not currently tender.  States she is tried wider shoes she is tried soaks and she tried padding and its been going on now for a minimum of 6 months and over a year as far as symptoms.  Patient is in stable health currently does not smoke   ROS      Objective:  Physical Exam  Neurovascular status intact muscle strength found to be adequate range of motion adequate with patient's hyperostosis medial aspect first metatarsal head left found to be red and painful with pressure and nodules of the arch region bilateral that are mildly tender but not symptomatic with work and inability to wear the shoes comfortably that she needs to wear     Assessment:  Significant structural HAV deformity left with pain along with plantar nodules bilateral moderate in intensity     Plan:  H&P reviewed all conditions and I do think bunion correction is necessary I explained procedure risk and patient wants surgery understanding risk.  I allowed her to read consent form for bunion correction explaining recovery and the fact that total recovery can take upwards of 6 months with no long-term guarantees and all complications cannot be ruled out that are listed or anything else that can occur with any type of surgery.  She is willing to accept all risk signs consent form and everything is discussed discussed and air fracture walker dispensed and fitted properly to her foot and I want her to get used to this prior to the surgery and wear it around the house.  Do not recommend current treatment of fibromas as they are not symptomatic  X-rays indicate elevation of the intermetatarsal angle left with hyperostosis and no indications calcification arch region bilateral

## 2022-04-15 ENCOUNTER — Encounter: Payer: Self-pay | Admitting: Podiatry

## 2022-04-18 ENCOUNTER — Telehealth: Payer: Self-pay

## 2022-04-18 NOTE — Telephone Encounter (Signed)
Brenda Reyes called to cancel her surgery with Dr. Paulla Dolly on 04/29/2022. She stated she doesn't have anyone to take her the day of surgery and no one to help her after surgery. She will call me back when she's ready to reschedule. Notified Dr. Paulla Dolly and Caren Griffins with [facility].

## 2022-05-02 ENCOUNTER — Ambulatory Visit
Admission: RE | Admit: 2022-05-02 | Discharge: 2022-05-02 | Disposition: A | Discharge status: Home / Self Care | Source: Ambulatory Visit | Attending: Family Medicine | Admitting: Family Medicine

## 2022-05-02 ENCOUNTER — Other Ambulatory Visit: Payer: Self-pay | Admitting: Family Medicine

## 2022-05-02 DIAGNOSIS — R591 Generalized enlarged lymph nodes: Secondary | ICD-10-CM

## 2022-05-05 ENCOUNTER — Encounter: Admitting: Podiatry

## 2023-05-13 DIAGNOSIS — R7301 Impaired fasting glucose: Secondary | ICD-10-CM | POA: Diagnosis not present

## 2023-05-21 DIAGNOSIS — E039 Hypothyroidism, unspecified: Secondary | ICD-10-CM | POA: Diagnosis not present

## 2023-05-21 DIAGNOSIS — H409 Unspecified glaucoma: Secondary | ICD-10-CM | POA: Diagnosis not present

## 2023-05-21 DIAGNOSIS — I10 Essential (primary) hypertension: Secondary | ICD-10-CM | POA: Diagnosis not present

## 2023-05-21 DIAGNOSIS — J301 Allergic rhinitis due to pollen: Secondary | ICD-10-CM | POA: Diagnosis not present

## 2023-06-30 DIAGNOSIS — H409 Unspecified glaucoma: Secondary | ICD-10-CM | POA: Diagnosis not present

## 2023-06-30 DIAGNOSIS — I10 Essential (primary) hypertension: Secondary | ICD-10-CM | POA: Diagnosis not present

## 2023-06-30 DIAGNOSIS — E039 Hypothyroidism, unspecified: Secondary | ICD-10-CM | POA: Diagnosis not present

## 2023-06-30 DIAGNOSIS — R7303 Prediabetes: Secondary | ICD-10-CM | POA: Diagnosis not present

## 2023-07-02 ENCOUNTER — Encounter: Payer: Self-pay | Admitting: Gastroenterology

## 2023-07-02 ENCOUNTER — Ambulatory Visit (INDEPENDENT_AMBULATORY_CARE_PROVIDER_SITE_OTHER): Payer: Medicare Other | Admitting: Gastroenterology

## 2023-07-02 VITALS — BP 118/64 | HR 66 | Wt 131.2 lb

## 2023-07-02 DIAGNOSIS — K5900 Constipation, unspecified: Secondary | ICD-10-CM | POA: Insufficient documentation

## 2023-07-02 NOTE — Patient Instructions (Signed)
Follow up as needed.   Colonoscopy due 2026.  _______________________________________________________  If your blood pressure at your visit was 140/90 or greater, please contact your primary care physician to follow up on this.  _______________________________________________________  If you are age 65 or older, your body mass index should be between 23-30. Your Body mass index is 20.86 kg/m. If this is out of the aforementioned range listed, please consider follow up with your Primary Care Provider.  If you are age 52 or younger, your body mass index should be between 19-25. Your Body mass index is 20.86 kg/m. If this is out of the aformentioned range listed, please consider follow up with your Primary Care Provider.   ________________________________________________________  The [facility] providers would like to encourage you to use [facility] to communicate with providers for non-urgent requests or questions.  Due to long hold times on the telephone, sending your provider a message by [facility] may be a faster and more efficient way to get a response.  Please allow 48 business hours for a response.  Please remember that this is for non-urgent requests.  _______________________________________________________

## 2023-07-02 NOTE — Progress Notes (Signed)
Agree with assessment and plan as outlined.  

## 2023-07-02 NOTE — Progress Notes (Signed)
07/02/2023 Brenda Reyes [id number] 06-28-1956   HISTORY OF PRESENT ILLNESS: This is a 67 year old female who is a patient Dr. Lanetta Inch.  She has a history of small bowel obstruction and ETOH use.  She was seen by Dr. Adela Lank in November 2022.  She says that she called for this appointment a couple of months ago when she was having some constipation.  She says that and made this appointment, but she ended up changing her diet, started eating more fiber, and started drinking some prune juice intermittently and everything is straightened out has been doing just fine.  She denies any abdominal pain.  She denies any rectal bleeding.  She is due for colonoscopy in April 2026.  Recall is placed in the system.  She is working with her PCP to try to reduce her ETOH use and gets labs done with her regularly.  Her last colonoscopy was in 2016 with Dr. Arlyce Dice which was normal other than internal hemorrhoids and diverticulosis.  He recommended a repeat exam in 10 years at that time.   Prior GI workup: - underwent 2 hemorrhoid bandings 2016 per Dr. Arlyce Dice - RA and RP area   Colonoscopy 02/14/2015 - diverticulosis, internal hemorrhoids - Dr. Arlyce Dice - repeat in 10 years   Colonoscopy 12/25/2009 - small splenic flexure polyp, diverticulosis, hemorrhoids - path c/w TA   CT scan 05/2017 - IMPRESSION: Developing small bowel obstruction with transition at a segment of thick wall small bowel loop in the left upper pelvis. Normal appendix.  Moderate bowel content in the colon.  Past Medical History:  Diagnosis Date   Borderline diabetes    Colon polyps 12/27/2009   Tubular adenomatous polyps   Diverticulosis    Graves disease 02/12/15   to start treatment after colonoscopiy   Hypertension    Hyperthyroidism    Internal hemorrhoids    SBO (small bowel obstruction) ([facility]) 05/2017   Past Surgical History:  Procedure Laterality Date   ABDOMINAL HYSTERECTOMY     HEMORRHOID SURGERY     RADIOACTIVE  TYHROID IODINE     TUBAL LIGATION      reports that she quit smoking about 6 years ago. Her smoking use included cigarettes. She has never used smokeless tobacco. She reports current alcohol use. She reports that she does not use drugs. family history includes Cancer - Other in her mother; Throat cancer in her father. Allergies  Allergen Reactions   Triamterene Other (See Comments)    Dizziness       Outpatient Encounter Medications as of 07/02/2023  Medication Sig   amLODipine (NORVASC) 10 MG tablet Take 10 mg by mouth daily.   diclofenac Sodium (VOLTAREN) 1 % GEL Apply topically.   latanoprost (XALATAN) 0.005 % ophthalmic solution 1 drop at bedtime.   levothyroxine (SYNTHROID) 75 MCG tablet Take 75 mcg by mouth daily before breakfast.   lisinopril-hydrochlorothiazide (PRINZIDE,ZESTORETIC) 20-12.5 MG per tablet Take 2 tablets by mouth daily.   meloxicam (MOBIC) 15 MG tablet Take 15 mg by mouth daily as needed for pain.   metoprolol (TOPROL-XL) 200 MG 24 hr tablet Take 200 mg by mouth daily.   NON FORMULARY [facility]  Scar Cream:  Verapamil 10%; Pentoxifylline 5% Apply 1 to 2 gm to affected areas 3-4 times daily; 100 gm  Refills are as needed  Faxed to [facility] on 01/17/20   potassium chloride (K-DUR) 10 MEQ tablet Take 10 mEq by mouth once.    Calcium Carbonate-Vitamin D 600-400 MG-UNIT  tablet Take 1 tablet by mouth daily. (Patient not taking: Reported on 07/02/2023)   [DISCONTINUED] cloNIDine (CATAPRES) 0.2 MG tablet Take 0.2 mg by mouth at bedtime as needed (bllod pressure).    [DISCONTINUED] diclofenac (FLECTOR) 1.3 % PTCH PLACE 1 PATCH ONTO THE SKIN 2 (TWO) TIMES DAILY.   [DISCONTINUED] levothyroxine (SYNTHROID, LEVOTHROID) 88 MCG tablet Take 88 mcg by mouth daily before breakfast.   No facility-administered encounter medications on file as of 07/02/2023.    REVIEW OF SYSTEMS  : All other systems reviewed and negative except where noted in the History of  Present Illness.   PHYSICAL EXAM: BP 118/64   Pulse 66   Wt 131 lb 3.2 oz (59.5 kg)   BMI 20.86 kg/m  General: Well developed female in no acute distress Head: Normocephalic and atraumatic Eyes:  Sclerae anicteric, conjunctiva pink. Ears: Normal auditory acuity Lungs: Clear throughout to auscultation; no W/R/R. Heart: Regular rate and rhythm; no M/R/G. Abdomen: Soft, non-distended.  BS present.  Non-tender. Musculoskeletal: Symmetrical with no gross deformities  Skin: No lesions on visible extremities Extremities: No edema  Neurological: Alert oriented x 4, grossly non-focal Psychological:  Alert and cooperative. Normal mood and affect  ASSESSMENT AND PLAN: *Constipation:  Had some constipation 2 months or so when she made this appt, but changed her diet and started drinking some prune juice intermittently and it has resolved.  She is doing well.  Is due for colonoscopy 02/2025 and recall is in.  Gets labs regularly with Dr. Parke Simmers, her PCP.   CC:  Renaye Rakers, MD

## 2023-08-06 ENCOUNTER — Ambulatory Visit: Payer: Medicare Other

## 2023-08-06 ENCOUNTER — Encounter: Payer: Self-pay | Admitting: Podiatry

## 2023-08-06 ENCOUNTER — Ambulatory Visit (INDEPENDENT_AMBULATORY_CARE_PROVIDER_SITE_OTHER): Payer: Medicare Other | Admitting: Podiatry

## 2023-08-06 VITALS — BP 118/78 | HR 58

## 2023-08-06 DIAGNOSIS — M21611 Bunion of right foot: Secondary | ICD-10-CM | POA: Diagnosis not present

## 2023-08-06 DIAGNOSIS — M21612 Bunion of left foot: Secondary | ICD-10-CM

## 2023-08-06 DIAGNOSIS — M21619 Bunion of unspecified foot: Secondary | ICD-10-CM

## 2023-08-06 DIAGNOSIS — M779 Enthesopathy, unspecified: Secondary | ICD-10-CM

## 2023-08-06 NOTE — Progress Notes (Signed)
Subjective:   Patient ID: Brenda Reyes, female   DOB: 67 y.o.   MRN: [id number]   HPI Patient presents not sure about her bunions and whether or not correction be necessary and does need orthotics due to flatfoot structure   ROS      Objective:  Physical Exam  Neurovascular status intact with patient who is developing more discomfort in her feet and does have structural bunion deformity bilateral     Assessment:  HAV deformity tendinitis bilateral     Plan:  H&P x-ray of her feet discussed surgical correction and distal osteotomies but organ to hold off currently and at this point orthotics were casted by pedorthist fitted well and we will get her into these and then make a decision on other things we can do.  Patient had all questions answered today  X-rays indicate moderate elevation of the 1 2 intermetatarsal angle of 13 to 14 degrees bilateral

## 2023-08-10 ENCOUNTER — Other Ambulatory Visit: Payer: Self-pay | Admitting: Podiatry

## 2023-08-10 DIAGNOSIS — M779 Enthesopathy, unspecified: Secondary | ICD-10-CM

## 2023-08-10 DIAGNOSIS — M21619 Bunion of unspecified foot: Secondary | ICD-10-CM

## 2023-08-18 DIAGNOSIS — Z23 Encounter for immunization: Secondary | ICD-10-CM | POA: Diagnosis not present

## 2023-08-18 DIAGNOSIS — I10 Essential (primary) hypertension: Secondary | ICD-10-CM | POA: Diagnosis not present

## 2023-08-18 DIAGNOSIS — E782 Mixed hyperlipidemia: Secondary | ICD-10-CM | POA: Diagnosis not present

## 2023-09-02 DIAGNOSIS — H2513 Age-related nuclear cataract, bilateral: Secondary | ICD-10-CM | POA: Diagnosis not present

## 2023-09-02 DIAGNOSIS — H401131 Primary open-angle glaucoma, bilateral, mild stage: Secondary | ICD-10-CM | POA: Diagnosis not present

## 2023-09-23 DIAGNOSIS — Z1231 Encounter for screening mammogram for malignant neoplasm of breast: Secondary | ICD-10-CM | POA: Diagnosis not present

## 2023-10-01 DIAGNOSIS — I10 Essential (primary) hypertension: Secondary | ICD-10-CM | POA: Diagnosis not present

## 2023-10-01 DIAGNOSIS — E782 Mixed hyperlipidemia: Secondary | ICD-10-CM | POA: Diagnosis not present

## 2023-10-01 DIAGNOSIS — E039 Hypothyroidism, unspecified: Secondary | ICD-10-CM | POA: Diagnosis not present

## 2023-10-01 DIAGNOSIS — R7303 Prediabetes: Secondary | ICD-10-CM | POA: Diagnosis not present

## 2023-10-16 ENCOUNTER — Other Ambulatory Visit: Payer: Medicare Other

## 2023-11-09 ENCOUNTER — Ambulatory Visit: Payer: Medicare Other

## 2023-11-09 NOTE — Progress Notes (Signed)
Patient was here to try on new orthotics  Orthotics were too narrow and is almost made for different patient I re-scanned patient and re-ordered orthotics  Patient will return when in for fitting

## 2023-11-19 DIAGNOSIS — Z720 Tobacco use: Secondary | ICD-10-CM | POA: Diagnosis not present

## 2023-11-19 DIAGNOSIS — R7303 Prediabetes: Secondary | ICD-10-CM | POA: Diagnosis not present

## 2023-11-19 DIAGNOSIS — E039 Hypothyroidism, unspecified: Secondary | ICD-10-CM | POA: Diagnosis not present

## 2023-11-19 DIAGNOSIS — I10 Essential (primary) hypertension: Secondary | ICD-10-CM | POA: Diagnosis not present

## 2023-11-19 DIAGNOSIS — Z6821 Body mass index (BMI) 21.0-21.9, adult: Secondary | ICD-10-CM | POA: Diagnosis not present

## 2023-12-10 DIAGNOSIS — H401131 Primary open-angle glaucoma, bilateral, mild stage: Secondary | ICD-10-CM | POA: Diagnosis not present

## 2023-12-10 DIAGNOSIS — H2513 Age-related nuclear cataract, bilateral: Secondary | ICD-10-CM | POA: Diagnosis not present

## 2023-12-10 DIAGNOSIS — H43812 Vitreous degeneration, left eye: Secondary | ICD-10-CM | POA: Diagnosis not present

## 2023-12-16 ENCOUNTER — Other Ambulatory Visit: Payer: Medicare Other

## 2023-12-25 ENCOUNTER — Ambulatory Visit: Payer: Medicare Other

## 2023-12-25 NOTE — Progress Notes (Signed)
Patient presents today to pick up custom molded foot orthotics, diagnosed with pes planus by Dr. Charlsie Merles.   Orthotics were dispensed and fit was satisfactory. Reviewed instructions for break-in and wear. Written instructions given to patient.  Patient will follow up as needed.  Addison Bailey CPed, CFo, CFm

## 2024-03-22 DIAGNOSIS — D179 Benign lipomatous neoplasm, unspecified: Secondary | ICD-10-CM | POA: Diagnosis not present

## 2024-03-22 DIAGNOSIS — I10 Essential (primary) hypertension: Secondary | ICD-10-CM | POA: Diagnosis not present

## 2024-05-02 DIAGNOSIS — H401131 Primary open-angle glaucoma, bilateral, mild stage: Secondary | ICD-10-CM | POA: Diagnosis not present

## 2024-05-02 DIAGNOSIS — H2513 Age-related nuclear cataract, bilateral: Secondary | ICD-10-CM | POA: Diagnosis not present

## 2024-05-02 DIAGNOSIS — H43812 Vitreous degeneration, left eye: Secondary | ICD-10-CM | POA: Diagnosis not present

## 2024-05-09 ENCOUNTER — Ambulatory Visit: Payer: Self-pay | Admitting: Surgery

## 2024-05-09 DIAGNOSIS — D1721 Benign lipomatous neoplasm of skin and subcutaneous tissue of right arm: Secondary | ICD-10-CM | POA: Diagnosis not present

## 2024-05-09 NOTE — H&P (Signed)
 Subjective    Chief Complaint: New Consultation (lipoma on right upper arm)       History of Present Illness: Brenda Reyes is a 69 y.o. female who is seen today as an office consultation at the request of Dr. Benjamine for evaluation of New Consultation (lipoma on right upper arm) .     This is a 68 year old female with hypertension and hypothyroidism who presents with at least a year of a slowly enlarging mass on her lateral right upper arm.  She has noticed that this is causing some discomfort to the underlying muscle.  This area has never become infected.  She brought this to the attention of her PCP who referred her to us  to discuss excision.     Review of Systems: A complete review of systems was obtained from the patient.  I have reviewed this information and discussed as appropriate with the patient.  See HPI as well for other ROS.   Review of Systems  Constitutional: Negative.   HENT: Negative.    Eyes: Negative.   Respiratory: Negative.    Cardiovascular: Negative.   Gastrointestinal: Negative.   Genitourinary: Negative.   Musculoskeletal: Negative.   Skin: Negative.   Neurological: Negative.   Endo/Heme/Allergies: Negative.   Psychiatric/Behavioral: Negative.          Medical History: Past Medical History      Past Medical History:  Diagnosis Date   Glaucoma (increased eye pressure)     Hypertension     Thyroid  disease          Problem List     Patient Active Problem List  Diagnosis   Acquired absence of genital organ   Alcohol abuse   Essential hypertension   Constipation   Hypothyroidism following radioiodine therapy        Past Surgical History       Past Surgical History:  Procedure Laterality Date   HYSTERECTOMY            Allergies       Allergies  Allergen Reactions   Triamterene Other (See Comments)      Dizziness        Medications Ordered Prior to Encounter        Current Outpatient Medications on File Prior to Visit   Medication Sig Dispense Refill   amLODIPine  (NORVASC ) 10 MG tablet 1 tablet Orally Once a day       latanoprost (XALATAN) 0.005 % ophthalmic solution ADMINISTER 1 DROP INTO EACH EYES AT BEDTIME.       levothyroxine  (SYNTHROID ) 75 MCG tablet Take 75 mcg by mouth every morning before breakfast (0630)       lisinopriL -hydroCHLOROthiazide  (ZESTORETIC ) 20-12.5 mg tablet         metoprolol  SUCCinate (TOPROL -XL) 200 MG XL tablet Take 200 mg by mouth once daily       potassium chloride  (KLOR-CON ) 10 MEQ ER tablet Take 10 mEq by mouth once daily        No current facility-administered medications on file prior to visit.        Family History       Family History  Problem Relation Age of Onset   High blood pressure (Hypertension) Mother     Diabetes Mother     Diabetes Sister     Breast cancer Sister     High blood pressure (Hypertension) Brother          Tobacco Use History  Social History  Tobacco Use  Smoking Status Every Day   Types: Cigarettes  Smokeless Tobacco Not on file        Social History  Social History         Socioeconomic History   Marital status: Unknown  Tobacco Use   Smoking status: Every Day      Types: Cigarettes  Vaping Use   Vaping status: Unknown  Substance and Sexual Activity   Alcohol use: Yes      Alcohol/week: 6.0 - 12.0 standard drinks of alcohol      Types: 6 - 12 Standard drinks or equivalent per week   Drug use: Never    Social Drivers of Health        Housing Stability: Unknown (05/09/2024)    Housing Stability Vital Sign     Homeless in the Last Year: No        Objective:         Vitals:    05/09/24 0925  BP: (!) 151/82  Pulse: 65  Temp: 36.5 C (97.7 F)  SpO2: 99%  Weight: 59.7 kg (131 lb 9.6 oz)  Height: 165.1 cm (5' 5)  PainSc: 0-No pain  PainLoc: Arm    Body mass index is 21.9 kg/m.   Physical Exam    Constitutional:  WDWN in NAD, conversant, no obvious deformities; lying in bed comfortably Eyes:   Pupils equal, round; sclera anicteric; moist conjunctiva; no lid lag HENT:  Oral mucosa moist; good dentition  Neck:  No masses palpated, trachea midline; no thyromegaly Lungs:  CTA bilaterally; normal respiratory effort CV:  Regular rate and rhythm; no murmurs; extremities well-perfused with no edema Abd:  +bowel sounds, soft, non-tender, no palpable organomegaly; no palpable hernias Musc: Normal gait; no apparent clubbing or cyanosis in extremities Right upper arm laterally just above the elbow shows a visible easily palpable 3 cm subcutaneous mass.  This is smooth and mobile.  No overlying skin changes Lymphatic:  No palpable cervical or axillary lymphadenopathy Skin:  Warm, dry; no sign of jaundice Psychiatric - alert and oriented x 4; calm mood and affect   Assessment and Plan:  Diagnoses and all orders for this visit:   Lipoma of right upper extremity     Recommend excision of subcutaneous lipoma of the right upper extremity (3 x 2.5 cm).  The surgical procedure has been discussed with the patient.  Potential risks, benefits, alternative treatments, and expected outcomes have been explained.  All of the patient's questions at this time have been answered.  The likelihood of reaching the patient's treatment goal is good.  The patient understands the proposed surgical procedure and wishes to proceed.     Brenda Farrey DEWAYNE LIMA, MD  05/09/2024 9:41 AM

## 2024-05-18 LAB — AMB RESULTS CONSOLE CBG: Glucose: 105

## 2024-05-18 NOTE — Progress Notes (Signed)
 Patient came to mobile screening. BP 129/83 non fasting glucose 105. No SDOH needs at this time.

## 2024-06-07 DIAGNOSIS — D179 Benign lipomatous neoplasm, unspecified: Secondary | ICD-10-CM | POA: Diagnosis not present

## 2024-06-07 DIAGNOSIS — E039 Hypothyroidism, unspecified: Secondary | ICD-10-CM | POA: Diagnosis not present

## 2024-06-07 DIAGNOSIS — I10 Essential (primary) hypertension: Secondary | ICD-10-CM | POA: Diagnosis not present

## 2024-06-07 DIAGNOSIS — Z01818 Encounter for other preprocedural examination: Secondary | ICD-10-CM | POA: Diagnosis not present

## 2024-06-07 DIAGNOSIS — R7303 Prediabetes: Secondary | ICD-10-CM | POA: Diagnosis not present

## 2024-06-08 DIAGNOSIS — H401131 Primary open-angle glaucoma, bilateral, mild stage: Secondary | ICD-10-CM | POA: Diagnosis not present

## 2024-06-08 DIAGNOSIS — H04123 Dry eye syndrome of bilateral lacrimal glands: Secondary | ICD-10-CM | POA: Diagnosis not present

## 2024-06-21 ENCOUNTER — Other Ambulatory Visit: Payer: Self-pay | Admitting: Surgery

## 2024-06-21 DIAGNOSIS — E882 Lipomatosis, not elsewhere classified: Secondary | ICD-10-CM | POA: Diagnosis not present

## 2024-06-21 DIAGNOSIS — D1721 Benign lipomatous neoplasm of skin and subcutaneous tissue of right arm: Secondary | ICD-10-CM | POA: Diagnosis not present

## 2024-06-22 LAB — SURGICAL PATHOLOGY

## 2024-07-17 ENCOUNTER — Ambulatory Visit (HOSPITAL_COMMUNITY)
Admission: EM | Admit: 2024-07-17 | Discharge: 2024-07-17 | Disposition: A | Discharge status: Home / Self Care | Attending: Emergency Medicine | Admitting: Emergency Medicine

## 2024-07-17 ENCOUNTER — Encounter (HOSPITAL_COMMUNITY): Payer: Self-pay

## 2024-07-17 DIAGNOSIS — K130 Diseases of lips: Secondary | ICD-10-CM

## 2024-07-17 HISTORY — DX: Unspecified glaucoma: H40.9

## 2024-07-17 MED ORDER — AMOXICILLIN-POT CLAVULANATE 875-125 MG PO TABS: 1.0000 | ORAL_TABLET | Freq: Two times a day (BID) | ORAL | 0 refills | Status: AC

## 2024-07-17 NOTE — ED Provider Notes (Signed)
 [facility]    CSN: [id number] Arrival date & time: 07/17/24  1002    HISTORY   Chief Complaint  Patient presents with   Oral Swelling   HPI Brenda Reyes is a pleasant, 68 y.o. female who presents to urgent care today. Patient states she fell and hit her lip on a coffee table about a week ago.  Patient states her teeth feel fine but her lip has been swollen since then.  Patient states that for a few days it to be getting better but then 2 days ago it started to get worse again.  Patient states she has been applying hydrogen peroxide to the area where she cut the inside of her lip against her tooth but this does not seem to be helping.  Patient states she is also noticed that drinking warm beverages helps the swelling go down and eases her discomfort.  Patient denies fever, redness, red streaking or pain radiating from the cut inside her lower lip.  The history is provided by the patient.   Past Medical History:  Diagnosis Date   Borderline diabetes    Colon polyps 12/27/2009   Tubular adenomatous polyps   Diverticulosis    Glaucoma    Graves disease 02/12/2015   to start treatment after colonoscopiy   Hypertension    Hyperthyroidism    Internal hemorrhoids    SBO (small bowel obstruction) (HCC) 05/2017   Patient Active Problem List   Diagnosis Date Noted   Constipation 07/02/2023   SBO (small bowel obstruction) (HCC) 04/13/2013   TOBACCO USER 11/15/2009   URI 11/15/2009   FIBROIDS, UTERUS 04/19/2007   ANEMIA, IRON DEFICIENCY NOS 04/19/2007   ABUSE, ALCOHOL, UNSPECIFIED 04/19/2007   HYPERTENSION, ESSENTIAL NOS 04/19/2007   Hemorrhoids 04/19/2007   HYSTERECTOMY, HX OF 09/10/2006   Past Surgical History:  Procedure Laterality Date   ABDOMINAL HYSTERECTOMY     HEMORRHOID SURGERY     RADIOACTIVE TYHROID IODINE     TUBAL LIGATION     OB History   No obstetric history on file.    Home Medications    Prior to Admission medications   Medication Sig  Start Date End Date Taking? Authorizing Provider  amLODipine  (NORVASC ) 10 MG tablet Take 10 mg by mouth daily. 11/29/12   Johnson, Clanford L, MD  Calcium Carbonate-Vitamin D  600-400 MG-UNIT tablet Take 1 tablet by mouth daily. Patient not taking: Reported on 07/02/2023    [provider]  diclofenac  Sodium (VOLTAREN) 1 % GEL Apply topically. 09/06/20   [provider]  latanoprost (XALATAN) 0.005 % ophthalmic solution 1 drop at bedtime. 11/24/20   [provider]  levothyroxine  (SYNTHROID ) 75 MCG tablet Take 75 mcg by mouth daily before breakfast.    [provider]  lisinopril -hydrochlorothiazide  (PRINZIDE ,ZESTORETIC ) 20-12.5 MG per tablet Take 2 tablets by mouth daily. 11/29/12   Johnson, Clanford L, MD  meloxicam (MOBIC) 15 MG tablet Take 15 mg by mouth daily as needed for pain.    [provider]  metoprolol  (TOPROL -XL) 200 MG 24 hr tablet Take 200 mg by mouth daily.    [provider]  NON FORMULARY [facility]  Scar Cream:  Verapamil 10%; Pentoxifylline 5% Apply 1 to 2 gm to affected areas 3-4 times daily; 100 gm  Refills are as needed  Faxed to [facility] on 01/17/20    Stover, Titorya, DPM  potassium chloride  (K-DUR) 10 MEQ tablet Take 10 mEq by mouth once.  01/14/13   Gherghe, Costin  M, MD    Family History Family History  Problem Relation Age of Onset   Throat cancer Father    Cancer - Other Mother        blood   Colon cancer Neg Hx    Stomach cancer Neg Hx    Social History Social History   Tobacco Use   Smoking status: Every Day    Current packs/day: 0.00    Types: Cigarettes    Last attempt to quit: 05/05/2017    Years since quitting: 7.2   Smokeless tobacco: Never  Vaping Use   Vaping status: Never Used  Substance Use Topics   Alcohol use: Yes    Alcohol/week: 3.0 standard drinks of alcohol    Types: 3 Cans of beer per week    Comment: daily   Drug use: No   Allergies   Triamterene  Review  of Systems Review of Systems Pertinent findings revealed after performing a 14 point review of systems has been noted in the history of present illness.  Physical Exam Vital Signs BP 134/80 (BP Location: Left Arm)   Pulse 66   Temp 97.9 F (36.6 C) (Oral)   Resp 14   SpO2 98%   No data found.  Physical Exam Vitals and nursing note reviewed.  Constitutional:      General: She is awake. She is not in acute distress.    Appearance: Normal appearance. She is well-developed and well-groomed. She is not ill-appearing.  HENT:     Mouth/Throat:     Lips: Pink. Lesions (Laceration on the right anterior aspect of lower lip corresponding to her right lower incisor with eschar and mild surrounding induration, area is tender to palpation, no area of fluctuance is noted) present.  Neurological:     Mental Status: She is alert.  Psychiatric:        Behavior: Behavior is cooperative.     Visual Acuity Right Eye Distance:   Left Eye Distance:   Bilateral Distance:    Right Eye Near:   Left Eye Near:    Bilateral Near:     UC Couse / Diagnostics / Procedures:     Radiology No results found.  Procedures Procedures (including critical care time) EKG  Pending results:  Labs Reviewed - No data to display  Medications Ordered in UC: Medications - No data to display  UC Diagnoses / Final Clinical Impressions(s)   I have reviewed the triage vital signs and the nursing notes.  Pertinent labs & imaging results that were available during my care of the patient were reviewed by me and considered in my medical decision making (see chart for details).    Final diagnoses:  Abscess of lip   Patient provided with a prescription for Augmentin  for 7 days for treatment of presumed infection of right inner lower lip.  Emergency precautions advised.  Please see discharge instructions below for details of plan of care as provided to patient. ED Prescriptions     Medication Sig Dispense  Auth. Provider   amoxicillin -clavulanate (AUGMENTIN ) 875-125 MG tablet Take 1 tablet by mouth 2 (two) times daily for 7 days. 14 tablet Joesph Shaver Scales, PA-C      PDMP not reviewed this encounter.  Pending results:  Labs Reviewed - No data to display    Discharge Instructions      Please read below to learn more about the medications, dosages and frequencies that I recommend to help alleviate your symptoms and to get you feeling  better soon:  Augmentin  (amoxicillin  - clavulanic acid):  Please take one (1) dose twice daily for 7 days.  This antibiotic can cause upset stomach, this will resolve once antibiotics are complete.  You are welcome to take a probiotic, eat yogurt, take Imodium while taking this medication.  Please avoid other systemic medications such as Maalox, Pepto-Bismol or milk of magnesia as they can interfere with the body's ability to absorb the antibiotics.    Even if you feel that your lip is completely healed, it is very important that you complete the full 7 days of antibiotics to ensure the infection does not come back.  As we discussed, you may consider continuing drinking warm beverages and eating warm soups or broths to help with swelling.  You could also apply a warm compress to the area for 20 minutes 3-4 times daily.    Please be advised that if symptoms have not meaningfully improved in the next 3 to 5 days, I recommend that you go to the emergency room for further evaluation.  The area may need to be opened and drained.   Thank you for visiting urgent care today.  We appreciate the opportunity to participate in your care.       Disposition Upon Discharge:  Condition: stable for discharge home  Patient presented with an acute illness with associated systemic symptoms and significant discomfort requiring urgent management. In my opinion, this is a condition that a prudent lay person (someone who possesses an average knowledge of health and  medicine) may potentially expect to result in complications if not addressed urgently such as respiratory distress, impairment of bodily function or dysfunction of bodily organs.   Routine symptom specific, illness specific and/or disease specific instructions were discussed with the patient and/or caregiver at length.   As such, the patient has been evaluated and assessed, work-up was performed and treatment was provided in alignment with urgent care protocols and evidence based medicine.  Patient/parent/caregiver has been advised that the patient may require follow up for further testing and treatment if the symptoms continue in spite of treatment, as clinically indicated and appropriate.  Patient/parent/caregiver has been advised to return to the [facility] or PCP if no better; to PCP or the Emergency Department if new signs and symptoms develop, or if the current signs or symptoms continue to change or worsen for further workup, evaluation and treatment as clinically indicated and appropriate  The patient will follow up with their current PCP if and as advised. If the patient does not currently have a PCP we will assist them in obtaining one.   The patient may need specialty follow up if the symptoms continue, in spite of conservative treatment and management, for further workup, evaluation, consultation and treatment as clinically indicated and appropriate.  Patient/parent/caregiver verbalized understanding and agreement of plan as discussed.  All questions were addressed during visit.  Please see discharge instructions below for further details of plan.  This office note has been dictated using Teaching laboratory technician.  Unfortunately, this method of dictation can sometimes lead to typographical or grammatical errors.  I apologize for your inconvenience in advance if this occurs.  Please do not hesitate to reach out to me if clarification is needed.      Joesph Shaver Scales,  PA-C 07/17/24 1054

## 2024-07-17 NOTE — Discharge Instructions (Signed)
 Please read below to learn more about the medications, dosages and frequencies that I recommend to help alleviate your symptoms and to get you feeling better soon:  Augmentin  (amoxicillin  - clavulanic acid):  Please take one (1) dose twice daily for 7 days.  This antibiotic can cause upset stomach, this will resolve once antibiotics are complete.  You are welcome to take a probiotic, eat yogurt, take Imodium while taking this medication.  Please avoid other systemic medications such as Maalox, Pepto-Bismol or milk of magnesia as they can interfere with the body's ability to absorb the antibiotics.    Even if you feel that your lip is completely healed, it is very important that you complete the full 7 days of antibiotics to ensure the infection does not come back.  As we discussed, you may consider continuing drinking warm beverages and eating warm soups or broths to help with swelling.  You could also apply a warm compress to the area for 20 minutes 3-4 times daily.    Please be advised that if symptoms have not meaningfully improved in the next 3 to 5 days, I recommend that you go to the emergency room for further evaluation.  The area may need to be opened and drained.   Thank you for visiting urgent care today.  We appreciate the opportunity to participate in your care.

## 2024-07-17 NOTE — ED Triage Notes (Signed)
 Patient reports that she fell a week ago and hit her bottom lip on a coffee table. Patient states she has had drainage from the bottom lip. Patient states she has been using H2O2 on her lip.

## 2024-07-29 DIAGNOSIS — I10 Essential (primary) hypertension: Secondary | ICD-10-CM | POA: Diagnosis not present

## 2024-07-29 DIAGNOSIS — Z23 Encounter for immunization: Secondary | ICD-10-CM | POA: Diagnosis not present

## 2024-08-11 NOTE — Progress Notes (Signed)
 The patient attended a screening event on 05/18/2024, where her blood pressure was measured at 129/83 mmHg, and her blood glucose was 105 mg/dl. During the event, the patient reported that she does smoke, has insurance, is established with a primary care provider (Dr. Jolee Leech), and no SDOH needs indicated at this time.  A chart review indicates Dr. Kennieth Leech [facility] as the pt's PCP. The patient's most recent office visit was on 11/19/2023. The pt's bp and smoking was addressed at that appt and is being monitored by her PCP. There are currently no [facility] visible upcoming appts indicated at this time. Chart review further indicates that the pt has [employer] and [facility] Medicare for her insurance. Pt has history as a smoker and has no SDOH needs indicated at this time.   Call Attempt #1: CHW called pt to discuss screening event and smoking cessation interest. CHW left vm.   Call Attempt #2: CHW called pt again to discuss screening event and smoking cessation. CHW left vm.   Call Attempt #3: CHW called pt for third attempt, pt did not answer. CHW left vm.  CHW sent courtesy letter with smoking cessation education in case needed by patient.   An additional follow up will be done in according to the health equity team's protocol.

## 2024-08-15 DIAGNOSIS — E782 Mixed hyperlipidemia: Secondary | ICD-10-CM | POA: Diagnosis not present

## 2024-08-15 DIAGNOSIS — I10 Essential (primary) hypertension: Secondary | ICD-10-CM | POA: Diagnosis not present

## 2024-08-23 DIAGNOSIS — E782 Mixed hyperlipidemia: Secondary | ICD-10-CM | POA: Diagnosis not present

## 2024-08-23 DIAGNOSIS — I10 Essential (primary) hypertension: Secondary | ICD-10-CM | POA: Diagnosis not present

## 2024-08-23 DIAGNOSIS — E039 Hypothyroidism, unspecified: Secondary | ICD-10-CM | POA: Diagnosis not present

## 2024-10-12 NOTE — Progress Notes (Addendum)
 Pt attended 05/18/24 screening event where her BP was 129/83 and her blood glucose was 105. Pt documented that she has insurance, a PCP listed as Bland,Veita MD, and noted that she is an active smoker. Pt did not note any SDOH needs at the time of the event.  Chart review indicate that she has insurance through [employer] and [employer]. Pt last seen her PCP on 11/19/23 and does not not have any [facility] visible future appointments at this time.  CHW called pt to follow up with pt on smoking cessation interest, VM left for pt asking her to call CHW back at her earliest convenience. Pt called CHW back on 10/19/24 and stated that she is no longer a smoker, pt stated that she decided to quit for her health about 2 months ago, pt also shared that she quit drinking beer as well. Pt also dicussed with me that she read up about a parasite that causes diabetes. Pt stated that she does not have diabetes but she is in fear of catching the diabetic parasite She stated that she has heard of a pill that kills the parasite and pt has been watching youtube videos and searching online about this topic. CHW let pt know that I am not clinical nor am I able to offer advice. CHW encouraged pt to follow up with her PCP Dr. Benjamine, pt stated that she has an appointment with her PCP tomorrow and will discuss the diabetic parasite with Dr. Benjamine. No follow up to be scheduled per [facility] protocol.

## 2024-10-15 ENCOUNTER — Emergency Department (HOSPITAL_COMMUNITY)
Admission: EM | Admit: 2024-10-15 | Discharge: 2024-10-15 | Disposition: A | Discharge status: Home / Self Care | Attending: Emergency Medicine | Admitting: Emergency Medicine

## 2024-10-15 ENCOUNTER — Emergency Department (HOSPITAL_COMMUNITY)

## 2024-10-15 DIAGNOSIS — I10 Essential (primary) hypertension: Secondary | ICD-10-CM | POA: Insufficient documentation

## 2024-10-15 DIAGNOSIS — E119 Type 2 diabetes mellitus without complications: Secondary | ICD-10-CM | POA: Insufficient documentation

## 2024-10-15 DIAGNOSIS — Z79899 Other long term (current) drug therapy: Secondary | ICD-10-CM | POA: Insufficient documentation

## 2024-10-15 DIAGNOSIS — F17210 Nicotine dependence, cigarettes, uncomplicated: Secondary | ICD-10-CM | POA: Insufficient documentation

## 2024-10-15 DIAGNOSIS — H53123 Transient visual loss, bilateral: Secondary | ICD-10-CM | POA: Insufficient documentation

## 2024-10-15 LAB — COMPREHENSIVE METABOLIC PANEL WITH GFR
ALT: 19 U/L (ref 0–44)
AST: 32 U/L (ref 15–41)
Albumin: 4.8 g/dL (ref 3.5–5.0)
Alkaline Phosphatase: 75 U/L (ref 38–126)
Anion gap: 11 (ref 5–15)
BUN: 11 mg/dL (ref 8–23)
CO2: 28 mmol/L (ref 22–32)
Calcium: 10 mg/dL (ref 8.9–10.3)
Chloride: 102 mmol/L (ref 98–111)
Creatinine, Ser: 0.59 mg/dL (ref 0.44–1.00)
GFR, Estimated: >60 mL/min (ref >60)
Glucose, Bld: 112 mg/dL — ABNORMAL HIGH (ref 70–99)
Potassium: 3.3 mmol/L — ABNORMAL LOW (ref 3.5–5.1)
Sodium: 141 mmol/L (ref 135–145)
Total Bilirubin: 0.4 mg/dL (ref 0.0–1.2)
Total Protein: 7.6 g/dL (ref 6.5–8.1)

## 2024-10-15 LAB — CBC WITH DIFFERENTIAL/PLATELET
Abs Immature Granulocytes: 0.01 K/uL (ref 0.00–0.07)
Basophils Absolute: 0 K/uL (ref 0.0–0.1)
Basophils Relative: 1 %
Eosinophils Absolute: 0.2 K/uL (ref 0.0–0.5)
Eosinophils Relative: 5 %
HCT: 39.5 % (ref 36.0–46.0)
Hemoglobin: 13.5 g/dL (ref 12.0–15.0)
Immature Granulocytes: 0 %
Lymphocytes Relative: 34 %
Lymphs Abs: 1.6 K/uL (ref 0.7–4.0)
MCH: 30.9 pg (ref 26.0–34.0)
MCHC: 34.2 g/dL (ref 30.0–36.0)
MCV: 90.4 fL (ref 80.0–100.0)
Monocytes Absolute: 0.7 K/uL (ref 0.1–1.0)
Monocytes Relative: 14 %
Neutro Abs: 2.2 K/uL (ref 1.7–7.7)
Neutrophils Relative %: 46 %
Platelets: 284 K/uL (ref 150–400)
RBC: 4.37 MIL/uL (ref 3.87–5.11)
RDW: 12.4 % (ref 11.5–15.5)
WBC: 4.7 K/uL (ref 4.0–10.5)
nRBC: 0 % (ref 0.0–0.2)

## 2024-10-15 LAB — CBG MONITORING, ED: Glucose-Capillary: 188 mg/dL — ABNORMAL HIGH (ref 70–99)

## 2024-10-15 MED ORDER — IOHEXOL 350 MG/ML SOLN
80.0000 mL | Freq: Once | INTRAVENOUS | Status: AC | PRN
  Administered 2024-10-15: 80 mL via INTRAVENOUS

## 2024-10-15 NOTE — ED Triage Notes (Signed)
 Patient ambulatory to triage Reports she woke up this morning and everything was black Put cold rags on face and was able to see again - vision at baseline in triage Reports lower extremity weakness starting Tuesday Denies pain in triage

## 2024-10-15 NOTE — ED Provider Notes (Signed)
 [place] EMERGENCY DEPARTMENT AT [facility] Provider Note  CSN: [id number] Arrival date & time: 10/15/24 1026  Chief Complaint(s) Extremity Weakness  HPI Brenda Reyes is a 68 y.o. female history of hypertension presenting to the emergency department with episode of vision loss.  Patient reports that she woke up this morning, felt she could not see.  She reports that she immediately to the bathroom and then she could see again.  This lasted briefly, she reports lasted only seconds.  She reports this was definitely in both eyes and reports she could see absolutely nothing at all.  She denies any partial visual loss or unilateral vision loss.  Denies any eye pain.  Denies any unilateral weakness, numbness, tingling, trouble walking, fevers, chills.  She reports that she has felt some weakness in the legs over the past few days but this is resolved.  She reports this was in both legs.  No chest pain, shortness of breath, numbness or tingling, weakness.  She reports no prior history of this.   Past Medical History Past Medical History:  Diagnosis Date   Borderline diabetes    Colon polyps 12/27/2009   Tubular adenomatous polyps   Diverticulosis    Glaucoma    Graves disease 02/12/2015   to start treatment after colonoscopiy   Hypertension    Hyperthyroidism    Internal hemorrhoids    SBO (small bowel obstruction) (HCC) 05/2017   Patient Active Problem List   Diagnosis Date Noted   Constipation 07/02/2023   SBO (small bowel obstruction) (HCC) 04/13/2013   TOBACCO USER 11/15/2009   URI 11/15/2009   FIBROIDS, UTERUS 04/19/2007   ANEMIA, IRON DEFICIENCY NOS 04/19/2007   ABUSE, ALCOHOL, UNSPECIFIED 04/19/2007   HYPERTENSION, ESSENTIAL NOS 04/19/2007   Hemorrhoids 04/19/2007   HYSTERECTOMY, HX OF 09/10/2006   Home Medication(s) Prior to Admission medications   Medication Sig Start Date End Date Taking? Authorizing Provider  amLODipine  (NORVASC ) 10 MG tablet Take  10 mg by mouth daily. 11/29/12   Johnson, Clanford L, MD  Calcium Carbonate-Vitamin D  600-400 MG-UNIT tablet Take 1 tablet by mouth daily. Patient not taking: Reported on 07/02/2023    [provider]  diclofenac  Sodium (VOLTAREN) 1 % GEL Apply topically. 09/06/20   [provider]  latanoprost (XALATAN) 0.005 % ophthalmic solution 1 drop at bedtime. 11/24/20   [provider]  levothyroxine  (SYNTHROID ) 75 MCG tablet Take 75 mcg by mouth daily before breakfast.    [provider]  lisinopril -hydrochlorothiazide  (PRINZIDE ,ZESTORETIC ) 20-12.5 MG per tablet Take 2 tablets by mouth daily. 11/29/12   Johnson, Clanford L, MD  meloxicam (MOBIC) 15 MG tablet Take 15 mg by mouth daily as needed for pain.    [provider]  metoprolol  (TOPROL -XL) 200 MG 24 hr tablet Take 200 mg by mouth daily.    [provider]  NON FORMULARY [facility]  Scar Cream:  Verapamil 10%; Pentoxifylline 5% Apply 1 to 2 gm to affected areas 3-4 times daily; 100 gm  Refills are as needed  Faxed to [facility] on 01/17/20    Stover, Titorya, DPM  potassium chloride  (K-DUR) 10 MEQ tablet Take 10 mEq by mouth once.  01/14/13   Trixie Nilda HERO, MD  Past Surgical History Past Surgical History:  Procedure Laterality Date   ABDOMINAL HYSTERECTOMY     HEMORRHOID SURGERY     RADIOACTIVE TYHROID IODINE     TUBAL LIGATION     Family History Family History  Problem Relation Age of Onset   Throat cancer Father    Cancer - Other Mother        blood   Colon cancer Neg Hx    Stomach cancer Neg Hx     Social History Social History   Tobacco Use   Smoking status: Every Day    Current packs/day: 0.00    Types: Cigarettes    Last attempt to quit: 05/05/2017    Years since quitting: 7.4   Smokeless tobacco: Never  Vaping Use    Vaping status: Never Used  Substance Use Topics   Alcohol use: Yes    Alcohol/week: 3.0 standard drinks of alcohol    Types: 3 Cans of beer per week    Comment: daily   Drug use: No   Allergies Triamterene  Review of Systems Review of Systems  All other systems reviewed and are negative.   Physical Exam Vital Signs  I have reviewed the triage vital signs BP 132/79 (BP Location: Right Arm)   Pulse (!) 57   Temp 97.9 F (36.6 C) (Oral)   Resp 18   Ht 5' 6 (1.676 m)   Wt 61.2 kg   SpO2 98%   BMI 21.79 kg/m  Physical Exam Vitals and nursing note reviewed.  Constitutional:      General: She is not in acute distress.    Appearance: She is well-developed.  HENT:     Head: Normocephalic and atraumatic.     Mouth/Throat:     Mouth: Mucous membranes are moist.  Eyes:     Pupils: Pupils are equal, round, and reactive to light.  Cardiovascular:     Rate and Rhythm: Normal rate and regular rhythm.     Heart sounds: No murmur heard. Pulmonary:     Effort: Pulmonary effort is normal. No respiratory distress.     Breath sounds: Normal breath sounds.  Abdominal:     General: Abdomen is flat.     Palpations: Abdomen is soft.     Tenderness: There is no abdominal tenderness.  Musculoskeletal:        General: No tenderness.     Right lower leg: No edema.     Left lower leg: No edema.  Skin:    General: Skin is warm and dry.  Neurological:     General: No focal deficit present.     Mental Status: She is alert. Mental status is at baseline.     Comments: Cranial nerves II through XII intact including visual field testing, strength 5 out of 5 in the bilateral upper and lower extremities, no sensory deficit to light touch, no dysmetria on finger-nose-finger testing, ambulatory with steady gait.   Psychiatric:        Mood and Affect: Mood normal.        Behavior: Behavior normal.     ED Results and Treatments Labs (all labs ordered are listed, but only abnormal results  are displayed) Labs Reviewed  COMPREHENSIVE METABOLIC PANEL WITH GFR - Abnormal; Notable for the following components:      Result Value   Potassium 3.3 (*)    Glucose, Bld 112 (*)    All other components within normal limits  CBG MONITORING, ED - Abnormal; Notable for  the following components:   Glucose-Capillary 188 (*)    All other components within normal limits  CBC WITH DIFFERENTIAL/PLATELET                                                                                                                          Radiology CT ANGIO HEAD NECK W WO CM Result Date: 10/15/2024 EXAM: CTA Head and Neck with Intravenous Contrast. CT Head without Contrast. CLINICAL HISTORY: Vision loss, binocular. TECHNIQUE: Axial CTA images of the head and neck performed with intravenous contrast. MIP reconstructed images were created and reviewed. Axial computed tomography images of the head/brain performed without intravenous contrast. Note: Per PQRS, the description of internal carotid artery percent stenosis, including 0 percent or normal exam, is based on [employer] American Symptomatic Carotid Endarterectomy Trial (NASCET) criteria. Dose reduction technique was used including one or more of the following: automated exposure control, adjustment of mA and kV according to patient size, and/or iterative reconstruction. CONTRAST: 80mL Omnipaque  IV COMPARISON: CT Head Jul 07, 2010 FINDINGS: CT HEAD: BRAIN: No acute intraparenchymal hemorrhage. No mass lesion. No CT evidence for acute territorial infarct. No midline shift or extra-axial collection. VENTRICLES: No hydrocephalus. ORBITS: The orbits are unremarkable. SINUSES AND MASTOIDS: The paranasal sinuses and mastoid air cells are clear. CTA NECK: COMMON CAROTID ARTERIES: No significant stenosis. No dissection or occlusion. INTERNAL CAROTID ARTERIES: No stenosis by NASCET criteria. No dissection or occlusion. VERTEBRAL ARTERIES: No significant stenosis. No dissection or  occlusion. CTA HEAD: ANTERIOR CEREBRAL ARTERIES: No significant stenosis. No occlusion. No aneurysm. MIDDLE CEREBRAL ARTERIES: No significant stenosis. No occlusion. No aneurysm. POSTERIOR CEREBRAL ARTERIES: No significant stenosis. No occlusion. No aneurysm. BASILAR ARTERY: No significant stenosis. No occlusion. No aneurysm. OTHER: SOFT TISSUES: No acute finding. No masses or lymphadenopathy. BONES: No acute osseous abnormality. IMPRESSION: 1. No acute intracranial hemorrhage or ischemic change. 2. No evidence of significant stenosis, aneurysmal dilatation, or dissection involving the arteries of the head and neck. Electronically signed by: Gilmore Molt MD 10/15/2024 12:52 PM EST RP Workstation: [id number]    Pertinent labs & imaging results that were available during my care of the patient were reviewed by me and considered in my medical decision making (see MDM for details).  Medications Ordered in ED Medications  iohexol  (OMNIPAQUE ) 350 MG/ML injection 80 mL (80 mLs Intravenous Contrast Given 10/15/24 1232)  Procedures Procedures  (including critical care time)  Medical Decision Making / ED Course   MDM:  68 year old presenting with transient episode of loss of vision of both eyes.  Patient reports that she feels completely back to baseline.  Her neurologic examination is normal.  Her visual fields are intact.  Unclear cause of episode, if symptoms are unilateral or patient reports partial vision loss would be more concerning for possible TIA.  Would be unusual for any central cause to cause bilateral vision loss.  Doubt any ocular pathology given complete resolution and lack of eye pain or discomfort.  Will obtain CTA head and neck to further evaluate  Clinical Course as of 10/15/24 1332  Sat Oct 15, 2024  1330 Patient continues to have no symptoms.   CTA head and neck was obtained without evidence of any acute process or significant underlying vascular disease.  Discussed with Dr. Kerwin with neurology, given bilateral nature not consistent with visual field cut and reassuring posterior circulation does not think patient needs a workup in the ER.  Discussed the patient, feels comfortable going home. Will discharge patient to home. All questions answered. Patient comfortable with plan of discharge. Return precautions discussed with patient and specified on the after visit summary.  [WS]    Clinical Course User Index [WS] Francesca, Elsie CROME, MD     Additional history obtained: -Additional history obtained from family -External records from outside source obtained and reviewed including: Chart review including previous notes, labs, imaging, consultation notes including prior notes    Lab Tests: -I ordered, reviewed, and interpreted labs.   The pertinent results include:   Labs Reviewed  COMPREHENSIVE METABOLIC PANEL WITH GFR - Abnormal; Notable for the following components:      Result Value   Potassium 3.3 (*)    Glucose, Bld 112 (*)    All other components within normal limits  CBG MONITORING, ED - Abnormal; Notable for the following components:   Glucose-Capillary 188 (*)    All other components within normal limits  CBC WITH DIFFERENTIAL/PLATELET    Notable for mild hyperglycemia    Imaging Studies ordered: I ordered imaging studies including CTA head and neck On my interpretation imaging demonstrates no acute process I independently visualized and interpreted imaging. I agree with the radiologist interpretation   Medicines ordered and prescription drug management: Meds ordered this encounter  Medications   iohexol  (OMNIPAQUE ) 350 MG/ML injection 80 mL    -I have reviewed the patients home medicines and have made adjustments as needed   Reevaluation: After the interventions noted above, I reevaluated the  patient and found that their symptoms have resolved  Co morbidities that complicate the patient evaluation  Past Medical History:  Diagnosis Date   Borderline diabetes    Colon polyps 12/27/2009   Tubular adenomatous polyps   Diverticulosis    Glaucoma    Graves disease 02/12/2015   to start treatment after colonoscopiy   Hypertension    Hyperthyroidism    Internal hemorrhoids    SBO (small bowel obstruction) (HCC) 05/2017      Dispostion: Disposition decision including need for hospitalization was considered, and patient discharged from emergency department.    Final Clinical Impression(s) / ED Diagnoses Final diagnoses:  Transient visual loss of both eyes     This chart was dictated using voice recognition software.  Despite best efforts to proofread,  errors can occur which can change the documentation meaning.    Francesca Elsie CROME, MD 10/15/24 1332

## 2024-10-15 NOTE — Discharge Instructions (Addendum)
 We evaluated you for your visual disturbance.  Your testing in the emergency department was reassuring including a CT scan of your head.  We think your symptoms are unlikely to be caused by a dangerous problem such as an eye problem or stroke.  Since we do not know the exact cause of your symptoms, we would like you to follow-up with an ophthalmologist.  Please call Dr. Mccuen for follow-up.  If you have any recurrent symptoms, or other symptoms like weakness in 1 side of your body, facial droop, trouble swallowing, double vision, trouble walking ,please immediately return to the emergency department.
